# Patient Record
Sex: Female | Born: 1990 | Race: White | Hispanic: No | Marital: Single | Smoking: Current every day smoker
Health system: Southern US, Community
[De-identification: ages and names within clinical notes are randomized; demographics above are authoritative.]

## PROBLEM LIST (undated history)

## (undated) DIAGNOSIS — E119 Type 2 diabetes mellitus without complications: Secondary | ICD-10-CM

## (undated) DIAGNOSIS — E785 Hyperlipidemia, unspecified: Secondary | ICD-10-CM

## (undated) DIAGNOSIS — I10 Essential (primary) hypertension: Secondary | ICD-10-CM

## (undated) HISTORY — DX: Essential (primary) hypertension: I10

## (undated) HISTORY — DX: Hyperlipidemia, unspecified: E78.5

---
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
## 2011-09-07 ENCOUNTER — Emergency Department
Admit: 2011-09-07 | Disposition: A | Discharge status: Home / Self Care | Payer: Self-pay | Source: Emergency Department | Admitting: Emergency Medicine

## 2011-09-07 LAB — CBC AND DIFFERENTIAL
Basophils Absolute Automated: 0.03 10*3/uL (ref 0.00–0.20)
Basophils Automated: 0 % (ref 0–2)
Eosinophils Absolute Automated: 0.21 10*3/uL (ref 0.00–0.70)
Eosinophils Automated: 2 % (ref 0–5)
Hematocrit: 33 % — ABNORMAL LOW (ref 37.0–47.0)
Hgb: 10.9 g/dL — ABNORMAL LOW (ref 12.0–16.0)
Lymphocytes Absolute Automated: 3.05 10*3/uL (ref 0.50–4.40)
Lymphocytes Automated: 24 % (ref 15–41)
MCH: 29.6 pg (ref 28.0–32.0)
MCHC: 33 g/dL (ref 32.0–36.0)
MCV: 89.7 fL (ref 80.0–100.0)
MPV: 9.8 fL (ref 9.4–12.3)
Monocytes Absolute Automated: 1.03 10*3/uL (ref 0.00–1.20)
Monocytes: 8 % (ref 0–11)
Neutrophils Absolute: 8.49 10*3/uL — ABNORMAL HIGH (ref 1.80–8.10)
Neutrophils: 66 % (ref 52–75)
Platelets: 322 10*3/uL (ref 140–400)
RBC: 3.68 10*6/uL — ABNORMAL LOW (ref 4.20–5.40)
RDW: 14 % (ref 12–15)
WBC: 12.81 10*3/uL — ABNORMAL HIGH (ref 3.50–10.80)

## 2011-09-07 LAB — COMPREHENSIVE METABOLIC PANEL
ALT: 44 U/L — ABNORMAL HIGH (ref 4–36)
AST (SGOT): 31 U/L (ref 10–41)
Albumin/Globulin Ratio: 1.4 (ref 1.1–1.8)
Albumin: 4 g/dL (ref 3.4–4.9)
Alkaline Phosphatase: 67 U/L (ref 43–112)
BUN: 12 mg/dL (ref 7–21)
Bilirubin, Total: 0.5 mg/dL (ref 0.2–1.0)
CO2: 29 mEq/L (ref 22–31)
Calcium: 9.7 mg/dL (ref 8.6–10.2)
Chloride: 101 mEq/L (ref 98–107)
Creatinine: 0.5 mg/dL (ref 0.5–1.4)
Globulin: 2.9 g/dL (ref 2.0–3.7)
Glucose: 159 mg/dL — ABNORMAL HIGH (ref 70–100)
Potassium: 4.2 mEq/L (ref 3.6–5.0)
Protein, Total: 6.9 g/dL (ref 6.0–8.0)
Sodium: 141 mEq/L (ref 136–143)

## 2011-09-07 LAB — PT AND APTT
PT INR: 0.9 (ref 0.9–1.1)
PT: 12.5 s — ABNORMAL LOW (ref 12.6–15.0)
PTT: 27 s (ref 23–37)

## 2011-09-07 LAB — GFR: EGFR: >60

## 2011-09-07 LAB — HCG, SERUM, QUALITATIVE: Hcg Qualitative: NEGATIVE

## 2013-05-18 ENCOUNTER — Ambulatory Visit
Admission: RE | Admit: 2013-05-18 | Discharge: 2013-05-18 | Disposition: A | Discharge status: Home / Self Care | Payer: Self-pay | Source: Ambulatory Visit | Attending: Internal Medicine | Admitting: Internal Medicine

## 2013-09-23 ENCOUNTER — Ambulatory Visit
Admission: RE | Admit: 2013-09-23 | Discharge: 2013-09-23 | Disposition: A | Discharge status: Home / Self Care | Payer: Self-pay | Source: Ambulatory Visit | Attending: Internal Medicine | Admitting: Internal Medicine

## 2014-08-27 ENCOUNTER — Emergency Department: Payer: BLUE CROSS/BLUE SHIELD

## 2014-08-27 ENCOUNTER — Emergency Department
Admission: EM | Admit: 2014-08-27 | Discharge: 2014-08-27 | Disposition: A | Discharge status: Home / Self Care | Payer: BLUE CROSS/BLUE SHIELD | Attending: Emergency Medicine | Admitting: Emergency Medicine

## 2014-08-27 DIAGNOSIS — Z7689 Persons encountering health services in other specified circumstances: Secondary | ICD-10-CM

## 2014-08-27 DIAGNOSIS — R030 Elevated blood-pressure reading, without diagnosis of hypertension: Secondary | ICD-10-CM | POA: Insufficient documentation

## 2014-08-27 DIAGNOSIS — L0591 Pilonidal cyst without abscess: Secondary | ICD-10-CM | POA: Insufficient documentation

## 2014-08-27 DIAGNOSIS — IMO0001 Reserved for inherently not codable concepts without codable children: Secondary | ICD-10-CM

## 2014-08-27 MED ORDER — IBUPROFEN 600 MG PO TABS
600.0000 mg | ORAL_TABLET | Freq: Four times a day (QID) | ORAL | Status: AC | PRN
Start: 2014-08-27 — End: ?

## 2014-08-27 MED ORDER — TRAMADOL HCL 50 MG PO TABS
50.0000 mg | ORAL_TABLET | Freq: Four times a day (QID) | ORAL | Status: DC | PRN
Start: 2014-08-27 — End: 2018-04-08

## 2014-08-27 MED ORDER — CLINDAMYCIN HCL 300 MG PO CAPS
300.0000 mg | ORAL_CAPSULE | Freq: Four times a day (QID) | ORAL | Status: AC
Start: 2014-08-27 — End: 2014-09-01

## 2014-08-27 NOTE — ED Notes (Signed)
Swelling and pain x 3-4 days to coccyx. No fever or chills.  No draiange from area.

## 2014-08-27 NOTE — ED Provider Notes (Signed)
EMERGENCY DEPARTMENT HISTORY AND PHYSICAL EXAM     Physician/Midlevel provider first contact with patient: 08/27/14 1741         Date: 08/27/2014  Patient Name: Megan Harding    History of Presenting Illness     Chief Complaint   Patient presents with   . Tailbone Pain       History Provided By: Patient    Chief Complaint: Pain and Swelling   Onset: 3-4 days ago  Timing: Gradual  Location: Top of Tailbone region   Quality: Painful and Swelling   Severity: Moderate  Modifying Factors: Difficulty sitting and lying down   Associated Symptoms: nausea     Additional History: Megan Harding is a 23 y.o. female presenting to the ED c/o gradually worsening swelling and pain to top tailbone region x 3-4 days ago. Pt sts pain has caused difficulty with sitting and lying down. Pt additionally reports nausea. Pt notes she has never experienced similar sx in the past. Denies fever, chills, rash, itching, drainage from affected area, gait changes, vomiting, and abdominal pain. No hx of HTN, elevated BP noticed in ED upon arrival. No other medical complaints. NKDA.     PCP: Pcp, Noneorunknown, MD    No current facility-administered medications for this encounter.     Current Outpatient Prescriptions   Medication Sig Dispense Refill   . clindamycin (CLEOCIN) 300 MG capsule Take 1 capsule (300 mg total) by mouth 4 (four) times daily. 20 capsule 0   . ibuprofen (ADVIL,MOTRIN) 600 MG tablet Take 1 tablet (600 mg total) by mouth every 6 (six) hours as needed for Pain or Fever. 30 tablet 0   . traMADol (ULTRAM) 50 MG tablet Take 1 tablet (50 mg total) by mouth every 6 (six) hours as needed for Pain (severe pain). 5 tablet 0       Past History     Past Medical History:  History reviewed. No pertinent past medical history.    Past Surgical History:  History reviewed. No pertinent past surgical history.    Family History:  No family history on file.    Social History:  History   Substance Use Topics   . Smoking status: Current Every Day  Smoker   . Smokeless tobacco: Never Used   . Alcohol Use: No       Allergies:  No Known Allergies    Review of Systems       Constitutional: Negative for fever or chills.   Neurological: Negative for speech changes, weakness, or numbness.  Eyes: Negative for visual changes or eye pain.  HENT: No headache. Negative for sore throat, neck pain, or runny nose.   Gastrointestinal: + for nausea. Negative for abdominal pain,  vomiting, diarrhea, or blood in stool.   Genitourinary: Negative for dysuria or hematuria.  Musculoskeletal:  Negative for gait changes.    Skin: Negative for itching, rash, or discharge. + for pain and swelling to top region of tailbone.  Hematological: Negative for easy bruising        Physical Exam   BP 153/91 mmHg  Pulse 103  Temp(Src) 98.4 F (36.9 C)  Resp 18  Ht 1.727 m  Wt 136.079 kg  BMI 45.63 kg/m2  SpO2 99%      Physical Exam   Constitutional: Oriented to person, place, and time and well-developed, well-nourished, and in no distress.   Head: Normocephalic and atraumatic.   Eyes: Conjunctivae normal and EOM are normal. Pupils are equal, round, and reactive to  light.    Neck: Normal range of motion. Neck supple. No thyromegaly present.   Abdominal: Soft. Non distended. Non tender. No rebound or guarding  Musculoskeletal: No peripheral edema. No calf swelling or tenderness.    Neurological: Patient is alert and oriented to person, place, and time. No cranial nerve deficit. Gait normal. GCS score is 15.   Skin: Area of swelling to superior aspect of the gluteal cleft, just right of midline, 2 cm in length. Erythematous. Tender. Fluctuant. No rash.   Psychiatric: Affect normal.       Diagnostic Study Results     Labs -     Results     ** No results found for the last 24 hours. **          Radiologic Studies -   Radiology Results (24 Hour)     ** No results found for the last 24 hours. **      .      Medical Decision Making   I am the first provider for this patient.    I reviewed the  vital signs, available nursing notes, past medical history, past surgical history, family history and social history.    Vital Signs: Reviewed the patient's vital signs.     Patient Vitals for the past 12 hrs:   BP Temp Pulse Resp   08/27/14 1836 (!) 153/91 mmHg - (!) 103 -   08/27/14 1739 (!) 164/101 mmHg 98.4 F (36.9 C) (!) 110 18       Pulse Oximetry Analysis: Normal 99% on RA    Procedures:    Incision/Drainage  Date/Time: 08/27/2014 6:04 PM  Performed by: Cherlyn Roberts  Authorized by: Cherlyn Roberts    Consent:     Consent obtained:  Verbal    Consent given by:  Patient  Location:     Type:  Abscess and pilonidal cyst    Size:  2 cm    Location: Superior aspect of the gluteal cleft, just right of midline.  Pre-procedure details:     Skin preparation:  Betadine  Sedation:     Sedation type: None.  Anesthesia (see MAR for exact dosages):     Anesthesia method:  None  Procedure details:     Complexity:  Simple    Incision types:  Stab incision    Scalpel blade:  11    Wound management:  Probed and deloculated    Drainage:  Purulent    Drainage amount:  Copious    Wound treatment:  Wound left open    Packing materials:  1/2 in gauze  Post-procedure details:     Patient tolerance of procedure:  Tolerated well, no immediate complications      Old Medical Records: Old medical records.  Nursing notes.     ED Course:     6:04 PM - I&D performed.    6:18 PM - Counseled on diagnosis, discussed plan of home care after I&D, f/u plans, prescribed Rxs, and signs and symptoms when to return to ED. Pt understands and agrees with plan. All questions and concerns were addressed and resolved.    6:30 PM - Pt is stable and ready for discharge.      Provider Notes: s/p drainage of pilonidal cyst. rx for abx given. D/c home. May follow up for wound check tomorrow.       Diagnosis     Clinical Impression:   1. Pilonidal cyst    2. Elevated blood pressure  3. Encounter for incision and drainage procedure        Treatment  Plan:   ED Disposition     Discharge Myles Lipps discharge to home/self care.    Condition at disposition: Stable          _______________________________    Attestations: This note is prepared by Raelene Bott acting as scribe for Audley Hose, MD    Audley Hose, MD - The scribe's documentation has been prepared under my direction and personally reviewed by me in its entirety.  I confirm that the note above accurately reflects all work, treatment, procedures, and medical decision making performed by me.      _______________________________                 Cherlyn Roberts, MD  09/03/14 [phone]

## 2014-08-27 NOTE — Discharge Instructions (Signed)
Please follow up with a primary care doctor. Contact information attached.     Please return for removal of packing and worsening symptoms. You can also remove packing at home.      Pilonidal Abscess    You have been diagnosed with a pilonidal abscess.    A Pilonidal abscess is a collection of pus (infection material). The pus collects in the natal cleft, which is commonly called the butt crack. The pus tends to get infected and cause great pain. It also causes drainage of very bad-smelling and pus-like drainage. This type of abscess is often caused by an abnormal tract (pathway) between the skin and deeper structures that causes repeat infections in the same place. The word "pilonidal" means a "nest of hairs." However, at surgery, only 50-75% of all pilonidal abscesses (50-75 out of 100) actually contain hair.     Adult men get pilonidal abscesses 3-4 times more than women. In children however, they are four times more common in girls. They happen mostly in adults in their 46s to 30s. They are rare after the age of 19.    Risk factors for pilonidal disease include: Being female, having a lot of body hair and being Caucasian (white). Working in a job with a lot of sitting, having a deep natal cleft (butt crack) and having hair in the natal cleft are risk factors. Obesity is a risk factor for having the problem recur (happen again).    Some symptoms are: Pain, swelling, redness and pain over the crease of the buttocks. Sometimes, there is fever (temperature higher than 100.75F / 38C). For more information about pilonidal abscesses, go to: https://www.stewart-rogers.com/      Your abscess was drained. This involves numbing up the skin over the abscess, sterilizing the area and making an incision. This is to let the pus drain out. Often, a small "wick" or packing is placed in the wound. This is to allow further drainage. The packing or wick must be changed in 1-2 days. Your symptoms should get a lot better over the 2 days  after drainage. The doctor will decide if you need antibiotics now for your abscess.   Follow-up IS VERY IMPORTANT. Plan to return here, go to the nearest Emergency Department or see your regular doctor in 1-2 days. This is to take out packing and check your progress.    There is about a 40% chance (4 out of 10) your abscess will come back. Therefore, make an appointment with a surgeon in the next few weeks. Sometimes the abnormal tract that caused the infection needs to be taken out.    YOU SHOULD SEEK MEDICAL ATTENTION IMMEDIATELY, EITHER HERE OR AT THE NEAREST EMERGENCY DEPARTMENT, IF ANY OF THE FOLLOWING OCCURS:   High fever (temperature higher than 100.75F / 38C) or shaking chills develop.   You feel lightheaded or weak.   You have increasing pain, swelling, tenderness or redness.   You feel sicker at any time or do not get better as expected.   New symptoms or concerns develop.         Elevated Blood Pressure    During your visit today your blood pressure was higher than normal.    Check your blood pressure several times over the next several days, then follow up with your regular doctor. If you do not have a doctor, ask the medical staff to refer you to one.    You may need medication for your blood pressure if it stays high.  Untreated high blood pressure can cause damage to your heart and kidneys and may lead to a heart attack or stroke. It is VERY IMPORTANT to follow up with your doctor.   Check your blood pressure daily and follow up with your doctor.   A doctor will diagnose high blood pressure only if your blood pressure is high for several days. Many pharmacies have machines that let you check your own blood pressure. You can also check with a fire station to see whether a paramedic will take your blood pressure. Another option is to purchase a blood pressure monitor to use at home. These are available at most pharmacies.     YOU SHOULD SEEK MEDICAL ATTENTION IMMEDIATELY, EITHER HERE OR  AT THE NEAREST EMERGENCY DEPARTMENT, IF ANY OF THE FOLLOWING OCCURS:   You have a sudden or severe headache.   You are numb, tingly, or weak on one side of your body, half of your face droops, or you have trouble speaking.   You have chest pain.   You are short of breath.

## 2014-08-31 ENCOUNTER — Emergency Department
Admission: EM | Admit: 2014-08-31 | Discharge: 2014-08-31 | Disposition: A | Discharge status: Home / Self Care | Payer: BLUE CROSS/BLUE SHIELD | Attending: Emergency Medicine | Admitting: Emergency Medicine

## 2014-08-31 ENCOUNTER — Emergency Department: Payer: BLUE CROSS/BLUE SHIELD

## 2014-08-31 DIAGNOSIS — L0591 Pilonidal cyst without abscess: Secondary | ICD-10-CM

## 2014-08-31 DIAGNOSIS — Z5189 Encounter for other specified aftercare: Secondary | ICD-10-CM

## 2014-08-31 DIAGNOSIS — Z09 Encounter for follow-up examination after completed treatment for conditions other than malignant neoplasm: Secondary | ICD-10-CM | POA: Insufficient documentation

## 2014-08-31 MED ORDER — SULFAMETHOXAZOLE-TMP DS 800-160 MG PO TABS
1.0000 | ORAL_TABLET | Freq: Once | ORAL | Status: AC
Start: 2014-08-31 — End: 2014-08-31
  Administered 2014-08-31: 1 via ORAL
  Filled 2014-08-31: qty 1

## 2014-08-31 MED ORDER — SULFAMETHOXAZOLE-TRIMETHOPRIM 800-160 MG PO TABS
1.0000 | ORAL_TABLET | Freq: Two times a day (BID) | ORAL | Status: DC
Start: 2014-08-31 — End: 2018-04-08

## 2014-08-31 NOTE — Discharge Instructions (Signed)
Wound Recheck    You have been seen today for a recheck of your wound.    Your wound appears to be doing well. There are no signs of infection or worsening of the wound.    Continue with your wound care. No further exams are necessary.    YOU SHOULD SEEK MEDICAL ATTENTION IMMEDIATELY, EITHER HERE OR AT THE NEAREST EMERGENCY DEPARTMENT, IF ANY OF THE FOLLOWING OCCURS:   Your wound looks red, you see pus, or you have pain, swelling, or fever (temperature higher than 100.36F / 38C). These are signs of infection.   You notice red streaks spreading from the site of the wound.   Your wound seems worse in any way.         Pilonidal Abscess    You have been diagnosed with a pilonidal abscess.    A Pilonidal abscess is a collection of pus (infection material). The pus collects in the natal cleft, which is commonly called the butt crack. The pus tends to get infected and cause great pain. It also causes drainage of very bad-smelling and pus-like drainage. This type of abscess is often caused by an abnormal tract (pathway) between the skin and deeper structures that causes repeat infections in the same place. The word "pilonidal" means a "nest of hairs." However, at surgery, only 50-75% of all pilonidal abscesses (50-75 out of 100) actually contain hair.     Adult men get pilonidal abscesses 3-4 times more than women. In children however, they are four times more common in girls. They happen mostly in adults in their 24s to 30s. They are rare after the age of 38.    Risk factors for pilonidal disease include: Being female, having a lot of body hair and being Caucasian (white). Working in a job with a lot of sitting, having a deep natal cleft (butt crack) and having hair in the natal cleft are risk factors. Obesity is a risk factor for having the problem recur (happen again).    Some symptoms are: Pain, swelling, redness and pain over the crease of the buttocks. Sometimes, there is fever (temperature higher than  100.36F / 38C). For more information about pilonidal abscesses, go to: https://www.stewart-rogers.com/      Your abscess was drained. This involves numbing up the skin over the abscess, sterilizing the area and making an incision. This is to let the pus drain out. Often, a small "wick" or packing is placed in the wound. This is to allow further drainage. The packing or wick must be changed in 1-2 days. Your symptoms should get a lot better over the 2 days after drainage. The doctor will decide if you need antibiotics now for your abscess.    There is about a 40% chance (4 out of 10) your abscess will come back. Therefore, make an appointment with a surgeon in the next few weeks. Sometimes the abnormal tract that caused the infection needs to be taken out.    YOU SHOULD SEEK MEDICAL ATTENTION IMMEDIATELY, EITHER HERE OR AT THE NEAREST EMERGENCY DEPARTMENT, IF ANY OF THE FOLLOWING OCCURS:   High fever (temperature higher than 100.36F / 38C) or shaking chills develop.   You feel lightheaded or weak.   You have increasing pain, swelling, tenderness or redness.   You feel sicker at any time or do not get better as expected.   New symptoms or concerns develop.         Sitz Bath (Edu)    You have been  instructed to take "sitz baths."    A sitz bath is a warm water bath used to cleanse and relieve pain in the perineal area (the skin on or around the vagina or buttocks). Sitz baths are usually recommended after childbirth or hemorrhoid surgery or for other painful rectal or anal conditions. They can help relieve discomfort, itching, and muscle spasms from inflammatory bowel disease and other conditions. They also help heal surgical wounds faster.    Take at least three sitz baths a day to clean and relieve discomfort in your perineal area, especially after urinating or having bowel movements. You can do it more often if needed. Keep taking sitz baths until your symptoms improve.     To take a sitz bath, place a plastic tub  over the toilet and fill it with warm water. Sit in the water for 15 to 20 minutes. If you prefer, you can sit in a bathtub filled with a few inches of water. Using a plastic sitz bath that fits over the toilet may be more convenient.     You can buy a sitz bath at most drug or medical supply stores. The medical staff may provide you with a sitz bath to take home following surgery or childbirth.    Prepare the items you will need for your sitz bath so everything is ready to go when you need to use it. You will need a soft towel, and a sitz bath kit, including tubing and a regulating clamp. Your doctor might also recommend adding medicine or salt to the bath.    DIRECTIONS: These are general directions. Please read the instructions in the package for more information.   Place the sitz bath over the toilet and make sure that it fits well.   Fill the bath with warm water. Be sure to hang the tubing used to fill the sitz bath at a level higher than the toilet.   A tube will run from the bag into the plastic basin (sitz bath) to ensure a continuous flow of water. There is a clamp to control the amount of water in the basin.   Sit in the sitz bath for at least 15-20 minutes.   When you are finished, use a soft towel to gently pat yourself dry. Remember to pat instead of rub.

## 2014-08-31 NOTE — ED Provider Notes (Signed)
EMERGENCY DEPARTMENT HISTORY AND PHYSICAL EXAM     Physician/Midlevel provider first contact with patient: 08/31/14 2116         Date: 08/31/2014  Patient Name: Megan Harding    History of Presenting Illness     Chief Complaint   Patient presents with   . Wound Check       History Provided By: Patient and Mother      Chief Complaint: Wound Check   Onset: today   Timing: constant   Severity: moderate  Modifying Factors: none   Associated Symptoms: none    Additional History: Megan Harding is a 23 y.o. female who presents to ED due to wound check of her pilonidal cyst x today. Pt noticed the cyst since last Sunday, 5 days ago.  Pt was seen here on 08/27/2014 and had an I&D w/ packing for the pilonidal cyst.  Pt removed the packing herself. Pt has been taking the Clindamycin as directed. Pt was instructed to come here for follow up.Pt denies fever or any other symptoms.     PCP: Pcp, Noneorunknown, MD      No current facility-administered medications for this encounter.     Current Outpatient Prescriptions   Medication Sig Dispense Refill   . clindamycin (CLEOCIN) 300 MG capsule Take 1 capsule (300 mg total) by mouth 4 (four) times daily. 20 capsule 0   . ibuprofen (ADVIL,MOTRIN) 600 MG tablet Take 1 tablet (600 mg total) by mouth every 6 (six) hours as needed for Pain or Fever. 30 tablet 0   . sulfamethoxazole-trimethoprim (BACTRIM DS) 800-160 MG per tablet Take 1 tablet by mouth 2 (two) times daily. 20 tablet no   . traMADol (ULTRAM) 50 MG tablet Take 1 tablet (50 mg total) by mouth every 6 (six) hours as needed for Pain (severe pain). 5 tablet 0       Past History     Past Medical History:  Past Medical History   Diagnosis Date   . Hypertension        Past Surgical History:  History reviewed. No pertinent past surgical history.    Family History:  History reviewed. No pertinent family history.    Social History:  History   Substance Use Topics   . Smoking status: Current Every Day Smoker -- 0.25 packs/day for  10 years     Types: Cigarettes   . Smokeless tobacco: Never Used   . Alcohol Use: No       Allergies:  No Known Allergies    Review of Systems     Review of Systems   Constitutional: Negative for fever and chills.        Positive for wound check.    HENT: Negative for ear discharge and hearing loss.    Eyes: Negative for discharge and redness.   Respiratory: Negative for choking.    Gastrointestinal: Negative for vomiting.   Genitourinary: Negative for difficulty urinating.   Musculoskeletal: Negative for gait problem.   Skin:        Positive for pilonidal cyst.    Allergic/Immunologic:        NKDA   Neurological: Negative for tremors.     Physical Exam   BP 154/92 mmHg  Pulse 109  Temp(Src) 98.6 F (37 C)  Resp 16  Ht 1.727 m  Wt 136.079 kg  BMI 45.63 kg/m2  SpO2 96%    Vital signs have been reviewed  Constitutional: Well appearing, non toxic  HEAD: Normocephalic, atraumatic  EYES: PERRL, EOMI  ENT: Normal pharynx, nasopharynx clear, tympanic membranes free bilaterally  NECK: Supple, FROM, NT, no meningeal signs  GI: Normal bowel sounds, non distended, non tender, soft, no guarding or rebound  BACK: No evidence of trauma or deformity.  No tenderness along thoracic or lumbar spine  SKIN: Warm and dry, in intergluteal cleft, there is a opening of approximately 0.5 cm in diameter with scan amount of pus draining.  There is surrounding induration measuring approximately 2 cm in both directions, nontender.    NEURO: AxOx3, CN 2-12 intact, strength 5/5 B, sensation intact B.      Diagnostic Study Results     Labs -     Results     ** No results found for the last 24 hours. **          Radiologic Studies -   Radiology Results (24 Hour)     ** No results found for the last 24 hours. **      .      Medical Decision Making   I am the first provider for this patient.    I reviewed the vital signs, available nursing notes, past medical history, past surgical history, family history and social history.    Vital  Signs-Reviewed the patient's vital signs.     Patient Vitals for the past 12 hrs:   BP Temp Pulse Resp   08/31/14 2105 (!) 154/92 mmHg 98.6 F (37 C) (!) 109 16       Pulse Oximetry Analysis - Normal 96% on RA    Old Medical Records: Old medical records.  Nursing notes.     ED Course:   9:37 PM -  Patient  is amenable for discharge. Discussed d/c instructions including sitz bath and outpatient f/u with colorectal surgeon, Dr. Venetia Maxon. Advised pt to take the Bactrim as prescribed in addition to taking her Clindamycin as previously prescribed.  Discussed return precautions. Pt verbalizes understanding and agrees with plan. All questions and concerns were addressed.     Provider Notes: 61 F has a draining pilonidal cyst.  There's induration that's not erythematous.  No need for further incision.  Given additional antibiotics, instructions for sitz baths and general surgery referral if needed.   Patient agrees to return for new or worsening symptoms.        Diagnosis     Clinical Impression:   1. Pilonidal cyst    2. Visit for wound check        _______________________________    Attestations:  This note is prepared by Tammi Klippel, acting as scribe for Brooke Bonito, DO    [place], [place], The scribe's documentation has been prepared under my direction and personally reviewed by me in its entirety.  I confirm that the note above accurately reflects all work, treatment, procedures, and medical decision making performed by me.      _______________________________            Brooke Bonito, DO  09/01/14 [phone]

## 2014-08-31 NOTE — ED Notes (Addendum)
Follow up for pilonidal  cyst, had incision and drainage on Sunday, packing removed by pt, taking clindamycin

## 2018-09-19 ENCOUNTER — Encounter (HOSPITAL_COMMUNITY): Payer: Self-pay | Admitting: Emergency Medicine

## 2018-09-19 ENCOUNTER — Ambulatory Visit (HOSPITAL_COMMUNITY)
Admission: EM | Admit: 2018-09-19 | Discharge: 2018-09-19 | Disposition: A | Discharge status: Home / Self Care | Payer: Self-pay | Attending: Internal Medicine | Admitting: Internal Medicine

## 2018-09-19 DIAGNOSIS — Z3202 Encounter for pregnancy test, result negative: Secondary | ICD-10-CM

## 2018-09-19 DIAGNOSIS — B9689 Other specified bacterial agents as the cause of diseases classified elsewhere: Secondary | ICD-10-CM | POA: Insufficient documentation

## 2018-09-19 DIAGNOSIS — E1165 Type 2 diabetes mellitus with hyperglycemia: Secondary | ICD-10-CM

## 2018-09-19 DIAGNOSIS — N390 Urinary tract infection, site not specified: Secondary | ICD-10-CM

## 2018-09-19 DIAGNOSIS — F17200 Nicotine dependence, unspecified, uncomplicated: Secondary | ICD-10-CM | POA: Insufficient documentation

## 2018-09-19 DIAGNOSIS — R319 Hematuria, unspecified: Secondary | ICD-10-CM

## 2018-09-19 DIAGNOSIS — Z975 Presence of (intrauterine) contraceptive device: Secondary | ICD-10-CM | POA: Insufficient documentation

## 2018-09-19 HISTORY — DX: Type 2 diabetes mellitus without complications: E11.9

## 2018-09-19 LAB — POCT URINALYSIS DIP (DEVICE)
BILIRUBIN URINE: NEGATIVE
Glucose, UA: >=1000 mg/dL — AB
Nitrite: POSITIVE — AB
PH: 7 (ref 5.0–8.0)
Protein, ur: 100 mg/dL — AB
Specific Gravity, Urine: 1.015 (ref 1.005–1.030)
Urobilinogen, UA: 0.2 mg/dL (ref 0.0–1.0)

## 2018-09-19 LAB — POCT PREGNANCY, URINE: PREG TEST UR: NEGATIVE

## 2018-09-19 LAB — GLUCOSE, CAPILLARY: GLUCOSE-CAPILLARY: 284 mg/dL — AB (ref 70–99)

## 2018-09-19 MED ORDER — METFORMIN HCL 500 MG PO TABS: 500.0000 mg | ORAL_TABLET | Freq: Every day | ORAL | 1 refills | Status: DC

## 2018-09-19 MED ORDER — PHENAZOPYRIDINE HCL 200 MG PO TABS: 200.0000 mg | ORAL_TABLET | Freq: Three times a day (TID) | ORAL | 0 refills | Status: DC

## 2018-09-19 MED ORDER — CEPHALEXIN 500 MG PO CAPS: 500.0000 mg | ORAL_CAPSULE | Freq: Two times a day (BID) | ORAL | 0 refills | Status: AC

## 2018-09-19 NOTE — Discharge Instructions (Addendum)
Urine culture sent.  We will call you with the results.   Push fluids and get plenty of rest.   Take antibiotic as directed and to completion Take pyridium as prescribed and as needed for symptomatic relief Follow up with PCP or with community Health if symptoms persists Return here or go to ER if you have any new or worsening symptoms such as fever, worsening abdominal pain, nausea/vomiting, flank pain, etc...  Please continue to monitor blood glucose at home and follow up with PCP or [facility] to establish care.   Metformin prescribed.  Take as directed.

## 2018-09-19 NOTE — ED Triage Notes (Signed)
Pt c/o flank pain and frequency with urination for several days.

## 2018-09-19 NOTE — ED Provider Notes (Signed)
[facility]   CC: UTI symptoms  SUBJECTIVE:  Lorenia Hoston is a 27 y.o. female who complains of urinary frequency, urgency, and flank pain x 2 days.  Patient denies a precipitating event, recent sexual encounter, excessive caffeine intake.  Complains of constant right sided flank pain.  Pain is 6/10.  Has tried OTC medications with minimal relief.  Symptoms are made worse with urination.  Denies similar symptoms in the past.  Complains of associated fever, chills.  Denies nausea, vomiting, abdominal pain, abnormal vaginal discharge or bleeding, hematuria.    LMP: No LMP recorded. (Menstrual status: IUD).  ROS: As in HPI.  Past Medical History:  Diagnosis Date  . Diabetes mellitus without complication (HCC)    History reviewed. No pertinent surgical history. No Known Allergies No current facility-administered medications on file prior to encounter.    No current outpatient medications on file prior to encounter.   Social History   Socioeconomic History  . Marital status: Single    Spouse name: Not on file  . Number of children: Not on file  . Years of education: Not on file  . Highest education level: Not on file  Occupational History  . Not on file  Social Needs  . Financial resource strain: Not on file  . Food insecurity:    Worry: Not on file    Inability: Not on file  . Transportation needs:    Medical: Not on file    Non-medical: Not on file  Tobacco Use  . Smoking status: Current Every Day Smoker  Substance and Sexual Activity  . Alcohol use: Never    Frequency: Never  . Drug use: Never  . Sexual activity: Not on file  Lifestyle  . Physical activity:    Days per week: Not on file    Minutes per session: Not on file  . Stress: Not on file  Relationships  . Social connections:    Talks on phone: Not on file    Gets together: Not on file    Attends religious service: Not on file    Active member of club or organization: Not on file    Attends  meetings of clubs or organizations: Not on file    Relationship status: Not on file  . Intimate partner violence:    Fear of current or ex partner: Not on file    Emotionally abused: Not on file    Physically abused: Not on file    Forced sexual activity: Not on file  Other Topics Concern  . Not on file  Social History Narrative  . Not on file   Family History  Problem Relation Age of Onset  . Cancer Mother     OBJECTIVE:  Vitals:   09/19/18 1748  BP: (!) 162/91  Pulse: 93  Resp: 16  Temp: 97.7 F (36.5 C)  SpO2: 98%   General appearance: AOx3 in no acute distress; laughing during encounter HEENT: NCAT.  Oropharynx clear.  Lungs: clear to auscultation bilaterally without adventitious breath sounds Heart: regular rate and rhythm.  Radial pulses 2+ symmetrical bilaterally Abdomen: soft; non-distended; no tenderness; bowel sounds present; no masses or organomegaly; no guarding or rebound tenderness Back: Mild RT sided CVA tenderness Extremities: no edema; symmetrical with no gross deformities Skin: warm and dry Neurologic: Ambulates from chair to exam table without difficulty Psychological: alert and cooperative; normal mood and affect  Labs Reviewed  GLUCOSE, CAPILLARY - Abnormal; Notable for the following components:  Result Value   Glucose-Capillary 284 (*)    All other components within normal limits  POCT URINALYSIS DIP (DEVICE) - Abnormal; Notable for the following components:   Glucose, UA >=1000 (*)    Ketones, ur TRACE (*)    Hgb urine dipstick LARGE (*)    Protein, ur 100 (*)    Nitrite POSITIVE (*)    Leukocytes, UA TRACE (*)    All other components within normal limits  POCT PREGNANCY, URINE    ASSESSMENT & PLAN:  1. Urinary tract infection with hematuria, site unspecified   2. Type 2 diabetes mellitus with hyperglycemia, without long-term current use of insulin (HCC)     Meds ordered this encounter  Medications  . cephALEXin (KEFLEX) 500 MG  capsule    Sig: Take 1 capsule (500 mg total) by mouth 2 (two) times daily for 10 days.    Dispense:  20 capsule    Refill:  0    Order Specific Question:   Supervising Provider    Answer:   Isa Rankin [phone]  . phenazopyridine (PYRIDIUM) 200 MG tablet    Sig: Take 1 tablet (200 mg total) by mouth 3 (three) times daily.    Dispense:  6 tablet    Refill:  0    Order Specific Question:   Supervising Provider    Answer:   Isa Rankin [phone]  . metFORMIN (GLUCOPHAGE) 500 MG tablet    Sig: Take 1 tablet (500 mg total) by mouth daily.    Dispense:  30 tablet    Refill:  1    Order Specific Question:   Supervising Provider    Answer:   Isa Rankin [[id number]]   Urine culture sent.  We will call you with the results.   Push fluids and get plenty of rest.   Take antibiotic as directed and to completion Take pyridium as prescribed and as needed for symptomatic relief Follow up with PCP or with community Health if symptoms persists Return here or go to ER if you have any new or worsening symptoms such as fever, worsening abdominal pain, nausea/vomiting, flank pain, etc...  Please continue to monitor blood glucose at home and follow up with PCP or [facility] to establish care.   Metformin prescribed.  Take as directed.    Outlined signs and symptoms indicating need for more acute intervention. Patient verbalized understanding. After Visit Summary given.     Rennis Harding, PA-C 09/19/18 1931

## 2018-09-19 NOTE — ED Triage Notes (Signed)
Pt states she just took azo

## 2018-09-22 LAB — URINE CULTURE: Culture: >=100000 — AB

## 2018-09-23 ENCOUNTER — Telehealth (HOSPITAL_COMMUNITY): Payer: Self-pay

## 2018-09-23 NOTE — Telephone Encounter (Signed)
Urine culture positive for E.coli, this was treated with keflex at urgent care visit. Attempted to reach patient. No answer at this time.

## 2019-09-02 ENCOUNTER — Ambulatory Visit (HOSPITAL_COMMUNITY)
Admission: EM | Admit: 2019-09-02 | Discharge: 2019-09-02 | Disposition: A | Discharge status: Home / Self Care | Payer: BC Managed Care – PPO | Attending: Emergency Medicine | Admitting: Emergency Medicine

## 2019-09-02 ENCOUNTER — Encounter (HOSPITAL_COMMUNITY): Payer: Self-pay

## 2019-09-02 ENCOUNTER — Other Ambulatory Visit: Payer: Self-pay

## 2019-09-02 DIAGNOSIS — T25222A Burn of second degree of left foot, initial encounter: Secondary | ICD-10-CM

## 2019-09-02 DIAGNOSIS — T310 Burns involving less than 10% of body surface: Secondary | ICD-10-CM | POA: Diagnosis not present

## 2019-09-02 DIAGNOSIS — Y92002 Bathroom of unspecified non-institutional (private) residence single-family (private) house as the place of occurrence of the external cause: Secondary | ICD-10-CM

## 2019-09-02 DIAGNOSIS — X088XXA Exposure to other specified smoke, fire and flames, initial encounter: Secondary | ICD-10-CM

## 2019-09-02 MED ORDER — SILVER SULFADIAZINE 1 % EX CREA: 1.0000 "application " | TOPICAL_CREAM | Freq: Two times a day (BID) | CUTANEOUS | 1 refills | Status: DC
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
## 2019-09-02 NOTE — Discharge Instructions (Addendum)
Keep your wound clean and dry.  Wash it gently with soap and water twice a day; then apply the Silvadene cream twice a day.    Return here or follow-up with your primary care provider if you have increased pain, redness, red streaks, pus-like drainage, fever, or other signs of infection.
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
## 2019-09-17 LAB — TSH: TSH: 1.45 (ref 0.41–5.90)

## 2019-09-17 LAB — LIPID PANEL
Cholesterol: 199 (ref 0–200)
HDL: 42 (ref 35–70)
LDL Cholesterol: 104
Triglycerides: 311 — AB (ref 40–160)

## 2019-09-17 LAB — HEPATIC FUNCTION PANEL
ALT: 19 (ref 7–35)
AST: 13 (ref 13–35)
Alkaline Phosphatase: 65 (ref 25–125)

## 2019-09-17 LAB — HEMOGLOBIN A1C: Hemoglobin A1C: 10.5

## 2019-09-17 LAB — BASIC METABOLIC PANEL
BUN: 8 (ref 4–21)
Creatinine: 0.5 (ref 0.5–1.1)
Glucose: 266
Potassium: 4.4 (ref 3.4–5.3)
Sodium: 136 — AB (ref 137–147)

## 2019-09-19 ENCOUNTER — Telehealth: Payer: Self-pay | Admitting: Nurse Practitioner

## 2019-09-19 NOTE — Telephone Encounter (Signed)

## 2019-09-20 ENCOUNTER — Ambulatory Visit (INDEPENDENT_AMBULATORY_CARE_PROVIDER_SITE_OTHER): Payer: BC Managed Care – PPO | Admitting: Nurse Practitioner

## 2019-09-20 ENCOUNTER — Other Ambulatory Visit: Payer: Self-pay

## 2019-09-20 ENCOUNTER — Encounter: Payer: Self-pay | Admitting: Nurse Practitioner

## 2019-09-20 VITALS — BP 126/74 | HR 83 | Temp 98.4°F | Ht 67.0 in | Wt 267.0 lb

## 2019-09-20 DIAGNOSIS — E781 Pure hyperglyceridemia: Secondary | ICD-10-CM | POA: Diagnosis not present

## 2019-09-20 DIAGNOSIS — S91302A Unspecified open wound, left foot, initial encounter: Secondary | ICD-10-CM | POA: Diagnosis not present

## 2019-09-20 DIAGNOSIS — Z23 Encounter for immunization: Secondary | ICD-10-CM

## 2019-09-20 DIAGNOSIS — E1142 Type 2 diabetes mellitus with diabetic polyneuropathy: Secondary | ICD-10-CM | POA: Diagnosis not present

## 2019-09-20 DIAGNOSIS — Z720 Tobacco use: Secondary | ICD-10-CM | POA: Insufficient documentation

## 2019-09-20 MED ORDER — OMEGA-3-ACID ETHYL ESTERS 1 G PO CAPS: 1.0000 g | ORAL_CAPSULE | Freq: Two times a day (BID) | ORAL | 2 refills | Status: DC

## 2019-09-20 MED ORDER — TRULICITY 0.75 MG/0.5ML ~~LOC~~ SOAJ: 0.7500 mg | SUBCUTANEOUS | 2 refills | Status: DC

## 2019-09-20 MED ORDER — METFORMIN HCL 500 MG PO TABS: 500.0000 mg | ORAL_TABLET | Freq: Two times a day (BID) | ORAL | 2 refills | Status: AC

## 2019-09-20 MED ORDER — CEPHALEXIN 500 MG PO CAPS: 500.0000 mg | ORAL_CAPSULE | Freq: Three times a day (TID) | ORAL | 0 refills | Status: DC

## 2019-09-20 MED ORDER — BLOOD GLUCOSE MONITOR KIT: PACK | 0 refills | Status: AC

## 2019-09-20 MED ORDER — CEPHALEXIN 500 MG PO CAPS: 500.0000 mg | ORAL_CAPSULE | Freq: Two times a day (BID) | ORAL | 0 refills | Status: DC
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
## 2019-09-20 NOTE — Patient Instructions (Addendum)
Start glucose check BID. Bring glucose reading to next office visit. Start metformin and trulicity as prescribed  You will be contacted to schedule appt with wound clinic. Continue silvadene cream with dressing change for now. Use knee high compression stocking during the day and off at night. Start keflex to treat cellulitis   Burn Care, Adult A burn is an injury to the skin or the tissues under the skin. There are three types of burns:  First degree. These burns may cause the skin to be red and a bit swollen.  Second degree. These burns are very painful and cause the skin to be very red. The skin may also leak fluid, look shiny, and start to have blisters.  Third degree. These burns cause permanent damage. They turn the skin white or black and make it look charred, dry, and leathery. Taking care of your burn properly can help to prevent pain and infection. It can also help the burn to heal more quickly. How is this treated? Right after a burn:  Rinse or soak the burn under cool water. Do this for several minutes. Do not put ice on your burn. That can cause more damage.  Lightly cover the burn with a clean (sterile) cloth (dressing). Burn care  Raise (elevate) the injured area above the level of your heart while sitting or lying down.  Follow instructions from your doctor about: ? How to clean and take care of the burn. ? When to change and remove the cloth.  Check your burn every day for signs of infection. Check for: ? More redness, swelling, or pain. ? Warmth. ? Pus or a bad smell. Medicine   Take over-the-counter and prescription medicines only as told by your doctor.  If you were prescribed antibiotic medicine, take or apply it as told by your doctor. Do not stop using the antibiotic even if your condition improves. General instructions  To prevent infection: ? Do not put butter, oil, or other home treatments on the burn. ? Do not scratch or pick at the  burn. ? Do not break any blisters. ? Do not peel skin.  Do not rub your burn, even when you are cleaning it.  Protect your burn from the sun. Contact a doctor if:  Your condition does not get better.  Your condition gets worse.  You have a fever.  Your burn looks different or starts to have black or red spots on it.  Your burn feels warm to the touch.  Your pain is not controlled with medicine. Get help right away if:  You have redness, swelling, or pain at the site of the burn.  You have fluid, blood, or pus coming from your burn.  You have red streaks near the burn.  You have very bad pain. This information is not intended to replace advice given to you by your health care provider. Make sure you discuss any questions you have with your health care provider. Document Released: 09/16/2008 Document Revised: 03/30/2019 Document Reviewed: 05/27/2016 Elsevier Patient Education  2020 [employer].

## 2019-09-23 ENCOUNTER — Encounter: Payer: Self-pay | Admitting: Nurse Practitioner

## 2019-09-23 ENCOUNTER — Telehealth: Payer: Self-pay | Admitting: Nurse Practitioner

## 2019-09-23 DIAGNOSIS — E781 Pure hyperglyceridemia: Secondary | ICD-10-CM

## 2019-09-23 MED ORDER — FENOFIBRATE 145 MG PO TABS: 145.0000 mg | ORAL_TABLET | Freq: Every day | ORAL | 5 refills | Status: DC

## 2019-09-23 NOTE — Telephone Encounter (Signed)
changed to fenofibrate due to insurance coverage

## 2019-09-23 NOTE — Telephone Encounter (Signed)
PA denied for Omega 3.   Not sure if you want to try Fibrate and Statin med. Example atorvastatin and simvastatin, fenofibrate, fenofibric acid.

## 2019-09-23 NOTE — Progress Notes (Signed)
Abstracted result and sent to scan  

## 2019-09-29 NOTE — Telephone Encounter (Signed)
LVM for the pt to call back, need to inform that PA denied and [facility] send in new rx.

## 2019-10-03 NOTE — Telephone Encounter (Signed)
Left another vm for the pt to call back.  

## 2019-10-04 ENCOUNTER — Ambulatory Visit: Payer: BC Managed Care – PPO | Admitting: Nurse Practitioner

## 2019-10-17 ENCOUNTER — Encounter (HOSPITAL_BASED_OUTPATIENT_CLINIC_OR_DEPARTMENT_OTHER): Payer: BC Managed Care – PPO | Attending: Internal Medicine | Admitting: Internal Medicine

## 2019-10-21 ENCOUNTER — Encounter: Payer: Self-pay | Admitting: Nurse Practitioner

## 2019-10-24 ENCOUNTER — Encounter: Payer: Self-pay | Admitting: Nurse Practitioner

## 2019-11-02 ENCOUNTER — Encounter (HOSPITAL_COMMUNITY): Payer: Self-pay | Admitting: Emergency Medicine

## 2019-11-02 ENCOUNTER — Emergency Department (HOSPITAL_COMMUNITY): Payer: Self-pay

## 2019-11-02 ENCOUNTER — Other Ambulatory Visit: Payer: Self-pay

## 2019-11-02 ENCOUNTER — Emergency Department (HOSPITAL_COMMUNITY)
Admission: EM | Admit: 2019-11-02 | Discharge: 2019-11-02 | Disposition: A | Discharge status: Home / Self Care | Payer: Self-pay | Attending: Emergency Medicine | Admitting: Emergency Medicine

## 2019-11-02 DIAGNOSIS — E119 Type 2 diabetes mellitus without complications: Secondary | ICD-10-CM | POA: Insufficient documentation

## 2019-11-02 DIAGNOSIS — X031XXS Exposure to smoke in controlled fire, not in building or structure, sequela: Secondary | ICD-10-CM | POA: Insufficient documentation

## 2019-11-02 DIAGNOSIS — Z79899 Other long term (current) drug therapy: Secondary | ICD-10-CM | POA: Insufficient documentation

## 2019-11-02 DIAGNOSIS — Z7984 Long term (current) use of oral hypoglycemic drugs: Secondary | ICD-10-CM | POA: Insufficient documentation

## 2019-11-02 DIAGNOSIS — E11622 Type 2 diabetes mellitus with other skin ulcer: Secondary | ICD-10-CM

## 2019-11-02 DIAGNOSIS — T798XXA Other early complications of trauma, initial encounter: Secondary | ICD-10-CM | POA: Insufficient documentation

## 2019-11-02 DIAGNOSIS — F17210 Nicotine dependence, cigarettes, uncomplicated: Secondary | ICD-10-CM | POA: Insufficient documentation

## 2019-11-02 DIAGNOSIS — T148XXA Other injury of unspecified body region, initial encounter: Secondary | ICD-10-CM

## 2019-11-02 LAB — CBG MONITORING, ED: Glucose-Capillary: 203 mg/dL — ABNORMAL HIGH (ref 70–99)
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
## 2019-11-02 NOTE — ED Notes (Signed)
ED Provider at bedside. 

## 2019-11-02 NOTE — Discharge Instructions (Addendum)
Please call the [facility] to schedule an appointment for further outpatient management of foot wound.   Social Work should contact you tomorrow to discuss available resources for medications, and medical care.
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
## 2019-11-22 ENCOUNTER — Other Ambulatory Visit: Payer: Self-pay

## 2019-11-22 ENCOUNTER — Encounter (HOSPITAL_BASED_OUTPATIENT_CLINIC_OR_DEPARTMENT_OTHER): Payer: Self-pay | Attending: Internal Medicine | Admitting: Internal Medicine

## 2019-11-22 DIAGNOSIS — L97522 Non-pressure chronic ulcer of other part of left foot with fat layer exposed: Secondary | ICD-10-CM | POA: Insufficient documentation

## 2019-11-22 DIAGNOSIS — E781 Pure hyperglyceridemia: Secondary | ICD-10-CM | POA: Insufficient documentation

## 2019-11-22 DIAGNOSIS — E1142 Type 2 diabetes mellitus with diabetic polyneuropathy: Secondary | ICD-10-CM | POA: Insufficient documentation

## 2019-11-22 DIAGNOSIS — X088XXD Exposure to other specified smoke, fire and flames, subsequent encounter: Secondary | ICD-10-CM | POA: Insufficient documentation

## 2019-11-22 DIAGNOSIS — E11621 Type 2 diabetes mellitus with foot ulcer: Secondary | ICD-10-CM | POA: Insufficient documentation

## 2019-11-22 DIAGNOSIS — G4730 Sleep apnea, unspecified: Secondary | ICD-10-CM | POA: Insufficient documentation

## 2019-11-22 DIAGNOSIS — T25322D Burn of third degree of left foot, subsequent encounter: Secondary | ICD-10-CM | POA: Insufficient documentation

## 2019-11-28 NOTE — Progress Notes (Signed)
Bianca, [name] ([id number]) Visit Report for 11/22/2019 Abuse/Suicide Risk Screen Details Patient Name: Date of Service: Bianca [name], Bianca [name] 11/22/2019 9:00 AM Medical Record [id number] Patient Account Number: [id number] Date of Birth/Sex: Treating RN: Aug 06, 1991 (28 y.o. Female) Levan Hurst Primary Care Suyash Amory: Wilfred Lacy Other Clinician: Referring Michaell Grider: Treating Kayton Dunaj/Extender:Robson, Gae Bon, Trilby Drummer in Treatment: 0 Abuse/Suicide Risk Screen Items Answer ABUSE RISK SCREEN: Has anyone close to you tried to hurt or harm you recentlyo No Do you feel uncomfortable with anyone in your familyo No Has anyone forced you do things that you didnt want to doo No Electronic Signature(s) Signed: 11/28/2019 5:51:44 PM By: Levan Hurst RN, BSN Entered By: Levan Hurst on 11/22/2019 09:05:33 -------------------------------------------------------------------------------- Activities of Daily Living Details Patient Name: Date of Service: Bianca, [name] 11/22/2019 9:00 AM Medical Record [id number] Patient Account Number: [id number] Date of Birth/Sex: Treating RN: 1991-11-08 (28 y.o. Female) Levan Hurst Primary Care Elleana Stillson: Wilfred Lacy Other Clinician: Referring Malichi Palardy: Treating Julieta Rogalski/Extender:Robson, Gae Bon, Trilby Drummer in Treatment: 0 Activities of Daily Living Items Answer Activities of Daily Living (Please select one for each item) Drive Automobile Completely Able Take Medications Completely Able Use Telephone Completely Able Care for Appearance Completely Able Use Toilet Completely Able Bath / Shower Completely Able Dress Self Completely Able Feed Self Completely Able Walk Completely Able Get In / Out Bed Completely Able Housework Completely Able Prepare Meals Completely [place] Completely Able Shop for Self Completely Able Electronic Signature(s) Signed: 11/28/2019 5:51:44 PM By: Levan Hurst RN,  BSN Entered By: Levan Hurst on 11/22/2019 09:05:51 -------------------------------------------------------------------------------- Education Screening Details Patient Name: Date of Service: Bianca, [name] 11/22/2019 9:00 AM Medical Record [id number] Patient Account Number: [id number] Date of Birth/Sex: Treating RN: 10-05-1991 (28 y.o. Female) Levan Hurst Primary Care Kimeka Badour: Wilfred Lacy Other Clinician: Referring Mariya Mottley: Treating Alyx Mcguirk/Extender:Robson, Gae Bon, Trilby Drummer in Treatment: 0 Primary Learner Assessed: Patient Learning Preferences/Education Level/Primary Language Learning Preference: Explanation, Demonstration, Printed Material Highest Education Level: High School Preferred Language: English Cognitive Barrier Language Barrier: No Translator Needed: No Memory Deficit: No Emotional Barrier: No Cultural/Religious Beliefs Affecting Medical Care: No Physical Barrier Impaired Vision: No Impaired Hearing: No Decreased Hand dexterity: No Knowledge/Comprehension Knowledge Level: High Comprehension Level: High Ability to understand written High instructions: Ability to understand verbal High instructions: Motivation Anxiety Level: Calm Cooperation: Cooperative Education Importance: Acknowledges Need Interest in Health Problems: Asks Questions Perception: Coherent Willingness to Engage in Self- High Management Activities: Readiness to Engage in Self- High Management Activities: Electronic Signature(s) Signed: 11/28/2019 5:51:44 PM By: Levan Hurst RN, BSN Entered By: Levan Hurst on 11/22/2019 09:06:16 -------------------------------------------------------------------------------- Fall Risk Assessment Details Patient Name: Date of Service: Bianca, [name] 11/22/2019 9:00 AM Medical Record [id number] Patient Account Number: [id number] Date of Birth/Sex: Treating RN: 05/15/1991 (28 y.o. Female) Levan Hurst Primary Care Janeisha Ryle: Wilfred Lacy Other Clinician: Referring Mattisyn Cardona: Treating Marcy Sookdeo/Extender:Robson, Gae Bon, Trilby Drummer in Treatment: 0 Fall Risk Assessment Items Have you had 2 or more falls in the last 12 monthso 0 No Have you had any fall that resulted in injury in the last 12 monthso 0 No FALLS RISK SCREEN History of falling - immediate or within 3 months 0 No Secondary diagnosis (Do you have 2 or more medical diagnoseso) 0 No Ambulatory aid None/bed rest/wheelchair/nurse 0 Yes Crutches/cane/walker 0 No Furniture 0 No Intravenous therapy Access/Saline/Heparin Lock 0 No Weak (short steps with or without shuffle, stooped but able to lift head 0 No while walking, may seek support from furniture) Impaired (short steps with shuffle, may have  difficulty arising from chair, 0 No head down, impaired balance) Mental Status Oriented to own ability 0 Yes Overestimates or forgets limitations 0 No Risk Level: Low Risk Score: 0 Electronic Signature(s) Signed: 11/28/2019 5:51:44 PM By: [name] RN, BSN Entered By: [name] on 11/22/2019 09:06:39 -------------------------------------------------------------------------------- Foot Assessment Details Patient Name: Date of Service: Bianca, [name] 11/22/2019 9:00 AM Medical Record [id number] Patient Account Number: [id number] Date of Birth/Sex: Treating RN: 10-Mar-1991 (28 y.o. Female) [name] Primary Care Nosson Wender: [name] Other Clinician: Referring Domonic Kimball: Treating [place] Catarino/Extender:Robson, [name], [name] in Treatment: 0 Foot Assessment Items Site Locations + = Sensation present, - = Sensation absent, C = Callus, U = Ulcer R = Redness, W = Warmth, M = Maceration, PU = Pre-ulcerative lesion F = Fissure, S = Swelling, D = Dryness Assessment Right: Left: Other Deformity: No No Prior Foot Ulcer: No No Prior Amputation: No No Charcot Joint: No No Ambulatory  Status: Ambulatory Without Help Gait: Steady Electronic Signature(s) Signed: 11/28/2019 5:51:44 PM By: [name] RN, BSN Entered By: [name] on 11/22/2019 09:10:32 -------------------------------------------------------------------------------- Nutrition Risk Screening Details Patient Name: Date of Service: DEVONIA, FARRO 11/22/2019 9:00 AM Medical Record [id number] Patient Account Number: [id number] Date of Birth/Sex: Treating RN: January 02, 1991 (28 y.o. Female) [name] Primary Care Nel Stoneking: [name] Other Clinician: Referring Christene Pounds: Treating Tessla Spurling/Extender:Robson, [name], [name] in Treatment: 0 Height (in): 67 Weight (lbs): 260 Body Mass Index (BMI): 40.7 Nutrition Risk Screening Items Score Screening NUTRITION RISK SCREEN: I have an illness or condition that made me change the kind and/or 2 Yes amount of food I eat I eat fewer than two meals per day 0 No I eat few fruits and vegetables, or milk products 0 No I have three or more drinks of beer, liquor or wine almost every day 0 No I have tooth or mouth problems that make it hard for me to eat 0 No I don't always have enough money to buy the food I need 0 No I eat alone most of the time 0 No I take three or more different prescribed or over-the-counter drugs a day 0 No 0 No Without wanting to, I have lost or gained 10 pounds in the last six months I am not always physically able to shop, cook and/or feed myself 0 No Nutrition Protocols Good Risk Protocol Provide education on elevated blood sugars and Moderate Risk Protocol 0 impact on wound healing, as applicable High Risk Proctocol Risk Level: Good Risk Score: 2 Electronic Signature(s) Signed: 11/28/2019 5:51:44 PM By: [name] RN, BSN Entered By: [name] on 11/22/2019 09:07:55

## 2019-11-29 ENCOUNTER — Other Ambulatory Visit: Payer: Self-pay

## 2019-11-29 ENCOUNTER — Encounter (HOSPITAL_BASED_OUTPATIENT_CLINIC_OR_DEPARTMENT_OTHER): Payer: Self-pay | Admitting: Internal Medicine

## 2019-11-29 NOTE — Progress Notes (Addendum)
Bianca [name], Emmakate ([id number]) Visit Report for 11/29/2019 Arrival Information Details Patient Name: Date of Service: Bianca [name], Bianca [name] 11/29/2019 8:00 AM Medical Record [id number] Patient Account Number: [id number] Date of Birth/Sex: Treating RN: May 07, 1991 (28 y.o. Debara PickettF) Deaton, Millard.LoaBobbi Primary Care Saliou Barnier: Alysia PennaNche, [name] Other Clinician: Referring Gaelle Adriance: Treating Leeandra Ellerson/Extender:Robson, Bethann PunchesMichael [name], Lazarus Gowdaharlotte Weeks in Treatment: 1 Visit Information History Since Last Visit Added or deleted any medications: No Patient Arrived: Ambulatory Any new allergies or adverse reactions: No Arrival Time: 08:04 Had a fall or experienced change in No Accompanied By: self activities of daily living that may affect Transfer Assistance: None risk of falls: Patient Identification Verified: Yes Signs or symptoms of abuse/neglect since last No Secondary Verification Process Completed: Yes visito Patient Requires Transmission-Based No Hospitalized since last visit: No Precautions: Implantable device outside of the clinic excluding No Patient Has Alerts: No cellular tissue based products placed in the center since last visit: Has Dressing in Place as Prescribed: Yes Pain Present Now: No Electronic Signature(s) Signed: 11/29/2019 5:32:49 PM By: Shawn Stalleaton, Bobbi Entered By: Shawn Stalleaton, Bobbi on 11/29/2019 08:04:31 -------------------------------------------------------------------------------- Encounter Discharge Information Details Patient Name: Date of Service: Bianca [name], Bianca [name] 11/29/2019 8:00 AM Medical Record [id number] Patient Account Number: [id number] Date of Birth/Sex: Treating RN: May 07, 1991 (28 y.o. Arta SilenceF) Deaton, Bobbi Primary Care Johathan Province: Alysia PennaNche, [name] Other Clinician: Referring Marton Malizia: Treating Aleria Maheu/Extender:Robson, Bethann PunchesMichael [name], Lazarus Gowdaharlotte Weeks in Treatment: 1 Encounter Discharge Information Items Post Procedure Vitals Discharge Condition: Stable Temperature (F):  98 Ambulatory Status: Ambulatory Pulse (bpm): 87 Discharge Destination: Home Respiratory Rate (breaths/min): 20 Transportation: Private Auto Blood Pressure (mmHg): 134/84 Accompanied By: self Schedule Follow-up Appointment: Yes Clinical Summary of Care: Electronic Signature(s) Signed: 11/29/2019 5:32:49 PM By: Shawn Stalleaton, Bobbi Entered By: Shawn Stalleaton, Bobbi on 11/29/2019 09:00:18 -------------------------------------------------------------------------------- Lower Extremity Assessment Details Patient Name: Date of Service: Bianca [name], Bianca [name] 11/29/2019 8:00 AM Medical Record [id number] Patient Account Number: [id number] Date of Birth/Sex: Treating RN: May 07, 1991 (28 y.o. Arta SilenceF) Deaton, Bobbi Primary Care Librada Castronovo: Alysia PennaNche, [name] Other Clinician: Referring Jill Ruppe: Treating Davine Coba/Extender:Robson, Bethann PunchesMichael [name], Lazarus Gowdaharlotte Weeks in Treatment: 1 Edema Assessment Assessed: [Left: No] [Right: No] Edema: [Left: N] [Right: o] Calf Left: Right: Point of Measurement: 30 cm From Medial Instep 42.5 cm cm Ankle Left: Right: Point of Measurement: 10 cm From Medial Instep 24 cm cm Electronic Signature(s) Signed: 11/29/2019 5:32:49 PM By: Shawn Stalleaton, Bobbi Entered By: Shawn Stalleaton, Bobbi on 11/29/2019 08:12:45 -------------------------------------------------------------------------------- Multi Wound Chart Details Patient Name: Date of Service: Bianca [name], Bianca [name] 11/29/2019 8:00 AM Medical Record [id number] Patient Account Number: [id number] Date of Birth/Sex: Treating RN: May 07, 1991 (28 y.o. F) Primary Care Essa Malachi: Alysia PennaNche, [name] Other Clinician: Referring Johnpaul Gillentine: Treating Shemia Bevel/Extender:Robson, Bethann PunchesMichael [name], Lazarus Gowdaharlotte Weeks in Treatment: 1 Vital Signs Height(in): 67 Pulse(bpm): 87 Weight(lbs): 260 Blood Pressure(mmHg): 134/84 Body Mass Index(BMI): 41 Temperature(F): 98 Respiratory 20 Rate(breaths/min): Photos: [N/A:N/A] Wound Location: Left Foot - Dorsal N/A N/A Wounding  Event: Thermal Burn N/A N/A Primary Etiology: 3rd degree Burn N/A N/A Secondary Etiology: Diabetic Wound/Ulcer of the N/A N/A Lower Extremity Comorbid History: Sleep Apnea, Type II N/A N/A Diabetes, History of Burn, Neuropathy Date Acquired: 09/01/2019 N/A N/A Weeks of Treatment: 1 N/A N/A Wound Status: Open N/A N/A Measurements L x W x D 4x3.9x0.2 N/A N/A (cm) Area (cm) : 12.252 N/A N/A Volume (cm) : 2.45 N/A N/A % Reduction in Area: 2.50% N/A N/A % Reduction in Volume: -94.90% N/A N/A Classification: Full Thickness Without N/A N/A Exposed Support Structures Exudate Amount: Medium N/A N/A Exudate Type: Serosanguineous N/A N/A Exudate Color: red, brown N/A N/A Wound Margin: Flat and Intact  N/A N/A Granulation Amount: Large (67-100%) N/A N/A Granulation Quality: Red, Pink N/A N/A Necrotic Amount: Small (1-33%) N/A N/A Exposed Structures: Fat Layer (Subcutaneous N/A N/A Tissue) Exposed: Yes Fascia: No Tendon: No Muscle: No Joint: No Bone: No Epithelialization: Small (1-33%) N/A N/A Procedures Performed: Dressings and/or N/A N/A debridement of burns; small Treatment Notes Wound #1 (Left, Dorsal Foot) 1. Cleanse With Wound Cleanser 2. Periwound Care Skin Prep 3. Primary Dressing Applied Hydrofera Blue 4. Secondary Dressing Dry Gauze Foam Border Dressing 5. Secured With Self Adhesive Bandage Notes per patient declined the gauze and kerlix too bulky to wear in shoe. case manager and MD made aware. Electronic Signature(s) Signed: 12/05/2019 6:02:21 PM By: [name] RN, BSN Previous Signature: 11/29/2019 5:53:37 PM Version By: [name] MD Entered By: [name] on 12/02/2019 08:23:13 -------------------------------------------------------------------------------- Multi-Disciplinary Care Plan Details Patient Name: Date of Service: Bianca [name], Bianca [name] 11/29/2019 8:00 AM Medical Record [id number] Patient Account Number: [id number] Date of  Birth/Sex: Treating RN: 1991-01-29 (28 y.o. Freddy Finner Primary Care Angelo Caroll: [name] Other Clinician: Referring Ammi Hutt: Treating Jerriah Ines/Extender:Robson, [name], [name] in Treatment: 1 Active Inactive Wound/Skin Impairment Nursing Diagnoses: Knowledge deficit related to ulceration/compromised skin integrity Goals: Patient/caregiver will verbalize understanding of skin care regimen Date Initiated: 11/22/2019 Target Resolution Date: 12/23/2019 Goal Status: Active Ulcer/skin breakdown will have a volume reduction of 30% by week 4 Date Initiated: 11/22/2019 Target Resolution Date: 12/23/2019 Goal Status: Active Interventions: Assess patient/caregiver ability to obtain necessary supplies Assess patient/caregiver ability to perform ulcer/skin care regimen upon admission and as needed Assess ulceration(s) every visit Notes: Electronic Signature(s) Signed: 11/29/2019 2:52:36 PM By: Yevonne Pax RN Entered By: Yevonne Pax on 11/29/2019 07:59:37 -------------------------------------------------------------------------------- Pain Assessment Details Patient Name: Date of Service: Bianca [name], Bianca [name] 11/29/2019 8:00 AM Medical Record [id number] Patient Account Number: [id number] Date of Birth/Sex: Treating RN: 16-Jul-1991 (28 y.o. Arta Silence Primary Care Daxtin Leiker: [name] Other Clinician: Referring Genesee Nase: Treating Adison Reifsteck/Extender:Robson, [name], [name] in Treatment: 1 Active Problems Location of Pain Severity and Description of Pain Patient Has Paino No Site Locations Rate the pain. Current Pain Level: 0 Pain Management and Medication Current Pain Management: Medication: No Cold Application: No Rest: No Massage: No Activity: No T.E.N.S.: No Heat Application: No Leg drop or elevation: No Is the Current Pain Management Adequate: Inadequate How does your wound impact your activities of daily livingo Sleep:  No Bathing: No Appetite: No Relationship With Others: No Bladder Continence: No Emotions: No Bowel Continence: No Work: No Toileting: No Drive: No Dressing: No Hobbies: No Electronic Signature(s) Signed: 11/29/2019 5:32:49 PM By: Shawn Stall Entered By: Shawn Stall on 11/29/2019 08:06:31 -------------------------------------------------------------------------------- Patient/Caregiver Education Details Patient Name: Date of Service: Bianca [name], Bianca [name] 12/8/2020andnbsp8:00 AM Medical Record [id number] Patient Account Number: [id number] Date of Birth/Gender: Treating RN: Apr 29, 1991 (28 y.o. Freddy Finner Primary Care Physician: [name] Other Clinician: Referring Physician: Treating Physician/Extender:Robson, [name], [name] in Treatment: 1 Education Assessment Education Provided To: Patient Education Topics Provided Wound/Skin Impairment: Methods: Explain/Verbal Responses: State content correctly Electronic Signature(s) Signed: 11/29/2019 2:52:36 PM By: Yevonne Pax RN Entered By: Yevonne Pax on 11/29/2019 07:59:52 -------------------------------------------------------------------------------- Wound Assessment Details Patient Name: Date of Service: Bianca [name], Bianca [name] 11/29/2019 8:00 AM Medical Record [id number] Patient Account Number: [id number] Date of Birth/Sex: Treating RN: [birth year] (28 y.o. Arta Silence Primary Care Ashawna Hanback: [name] Other Clinician: Referring Amiya Escamilla: Treating Justise Ehmann/Extender:Robson, [name], [name] in Treatment: 1 Wound Status Wound Number: 1 Primary 3rd degree Burn Etiology: Wound Location: Left Foot -  Dorsal Secondary Diabetic Wound/Ulcer of the Lower Wounding Event: Thermal Burn Etiology: Extremity Date Acquired: 09/01/2019 Wound Open Weeks Of Treatment: 1 Status: Clustered Wound: No Comorbid Sleep Apnea, Type II Diabetes, History of History: Burn,  Neuropathy Photos Wound Measurements Length: (cm) 4 % Reduct Width: (cm) 3.9 % Reduct Depth: (cm) 0.2 Epitheli Area: (cm) 12.252 Tunneli Volume: (cm) 2.45 Undermi Wound Description Full Thickness Without Exposed Support Foul Od Classification: Structures Slough/ Wound Flat and Intact Margin: Exudate Medium Amount: Exudate Serosanguineous Type: Exudate red, brown Color: Wound Bed Granulation Amount: Large (67-100%) Granulation Quality: Red, Pink Fascia E Necrotic Amount: Small (1-33%) Fat Laye Necrotic Quality: Adherent Slough Tendon E Muscle E Joint Ex Bone Exp or After Cleansing: No Fibrino Yes Exposed Structure xposed: No r (Subcutaneous Tissue) Exposed: Yes xposed: No xposed: No posed: No osed: No ion in Area: 2.5% ion in Volume: -94.9% alization: Small (1-33%) ng: No ning: No Treatment Notes Wound #1 (Left, Dorsal Foot) 1. Cleanse With Wound Cleanser 2. Periwound Care Skin Prep 3. Primary Dressing Applied Hydrofera Blue 4. Secondary Dressing Dry Gauze Foam Border Dressing 5. Secured With Self Adhesive Bandage Notes per patient declined the gauze and kerlix too bulky to wear in shoe. case manager and MD made aware. Electronic Signature(s) Signed: 12/02/2019 6:25:32 PM By: Deon Pilling Signed: 12/05/2019 6:02:21 PM By: Levan Hurst RN, BSN Previous Signature: 11/30/2019 11:25:39 AM Version By: Mikeal Hawthorne EMT/HBOT Previous Signature: 11/30/2019 11:25:39 AM Version By: Mikeal Hawthorne EMT/HBOT Previous Signature: 11/30/2019 12:05:21 PM Version By: Deon Pilling Previous Signature: 11/29/2019 5:32:49 PM Version By: Deon Pilling Entered By: Levan Hurst on 12/02/2019 08:21:13 -------------------------------------------------------------------------------- Vitals Details Patient Name: Date of Service: Bianca [name], Bianca [name] 11/29/2019 8:00 AM Medical Record [id number] Patient Account Number: [id number] Date of Birth/Sex: Treating  RN: November 21, 1991 (28 y.o. Helene Shoe, Meta.Reding Primary Care Hania Cerone: Wilfred Lacy Other Clinician: Referring Krew Hortman: Treating Calvert Charland/Extender:Robson, Gae Bon, Trilby Drummer in Treatment: 1 Vital Signs Time Taken: 08:05 Temperature (F): 98 Height (in): 67 Pulse (bpm): 87 Weight (lbs): 260 Respiratory Rate (breaths/min): 20 Body Mass Index (BMI): 40.7 Blood Pressure (mmHg): 134/84 Reference Range: 80 - 120 mg / dl Electronic Signature(s) Signed: 11/29/2019 5:32:49 PM By: Deon Pilling Entered By: Deon Pilling on 11/29/2019 08:07:03
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
## 2019-12-06 ENCOUNTER — Other Ambulatory Visit: Payer: Self-pay

## 2019-12-06 ENCOUNTER — Encounter (HOSPITAL_BASED_OUTPATIENT_CLINIC_OR_DEPARTMENT_OTHER): Payer: Self-pay | Admitting: Internal Medicine
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
## 2019-12-08 NOTE — Progress Notes (Signed)
Bianca [name], Bianca [name] ([id number]) Visit Report for 12/06/2019 Arrival Information Details Patient Name: Date of Service: Bianca [name], Bianca [name] 12/06/2019 8:00 AM Medical Record [id number] Patient Account Number: [id number] Date of Birth/Sex: Treating RN: May 28, 1991 (28 y.o. Wynelle Link Primary Care Udell Blasingame: [name] Other Clinician: Referring Val Farnam: Treating Eulises Kijowski/Extender:Robson, [name], [name] in Treatment: 2 Visit Information History Since Last Visit Added or deleted any medications: No Patient Arrived: Ambulatory Any new allergies or adverse reactions: No Arrival Time: 07:59 Had a fall or experienced change in No Accompanied By: alone activities of daily living that may affect Transfer Assistance: None risk of falls: Patient Identification Verified: Yes Signs or symptoms of abuse/neglect since last No Secondary Verification Process Completed: Yes visito Patient Requires Transmission-Based No Hospitalized since last visit: No Precautions: Implantable device outside of the clinic excluding No Patient Has Alerts: No cellular tissue based products placed in the center since last visit: Has Dressing in Place as Prescribed: Yes Pain Present Now: No Electronic Signature(s) Signed: 12/08/2019 5:32:06 PM By: [name] RN, BSN Entered By: [name] on 12/06/2019 07:59:42 -------------------------------------------------------------------------------- Clinic Level of Care Assessment Details Patient Name: Date of Service: Bianca [name], Bianca [name] 12/06/2019 8:00 AM Medical Record [id number] Patient Account Number: [id number] Date of Birth/Sex: Treating RN: 1991/08/28 (28 y.o. Freddy Finner Primary Care Declan Adamson: [name] Other Clinician: Referring Horrace Hanak: Treating Randon Somera/Extender:Robson, [name], [name] in Treatment: 2 Clinic Level of Care Assessment Items TOOL 4 Quantity Score X - Use when only an EandM is  performed on FOLLOW-UP visit 1 0 ASSESSMENTS - Nursing Assessment / Reassessment X - Reassessment of Co-morbidities (includes updates in patient status) 1 10 X - Reassessment of Adherence to Treatment Plan 1 5 ASSESSMENTS - Wound and Skin Assessment / Reassessment X - Simple Wound Assessment / Reassessment - one wound 1 5 []  - Complex Wound Assessment / Reassessment - multiple wounds 0 []  - Dermatologic / Skin Assessment (not related to wound area) 0 ASSESSMENTS - Focused Assessment []  - Circumferential Edema Measurements - multi extremities 0 []  - Nutritional Assessment / Counseling / Intervention 0 []  - Lower Extremity Assessment (monofilament, tuning fork, pulses) 0 []  - Peripheral Arterial Disease Assessment (using hand held doppler) 0 ASSESSMENTS - Ostomy and/or Continence Assessment and Care []  - Incontinence Assessment and Management 0 []  - Ostomy Care Assessment and Management (repouching, etc.) 0 PROCESS - Coordination of Care X - Simple Patient / Family Education for ongoing care 1 15 []  - Complex (extensive) Patient / Family Education for ongoing care 0 X - Staff obtains , Records, Test Results / Process Orders 1 10 []  - Staff telephones HHA, Nursing Homes / Clarify orders / etc 0 []  - Routine Transfer to another Facility (non-emergent condition) 0 []  - Routine Hospital Admission (non-emergent condition) 0 []  - New Admissions / / Ordering NPWT, Apligraf, etc. 0 []  - Emergency Hospital Admission (emergent condition) 0 X - Simple Discharge Coordination 1 10 []  - Complex (extensive) Discharge Coordination 0 PROCESS - Special Needs []  - Pediatric / Minor Patient Management 0 []  - Isolation Patient Management 0 []  - Hearing / Language / Visual special needs 0 []  - Assessment of Community assistance (transportation, D/C planning, etc.) 0 []  - Additional assistance / Altered mentation 0 []  - Support Surface(s) Assessment (bed, cushion, seat, etc.)  0 INTERVENTIONS - Wound Cleansing / Measurement X - Simple Wound Cleansing - one wound 1 5 []  - Complex Wound Cleansing - multiple wounds 0 X - Wound Imaging (photographs - any  number of wounds) 1 5 []  - Wound Tracing (instead of photographs) 0 X - Simple Wound Measurement - one wound 1 5 []  - Complex Wound Measurement - multiple wounds 0 INTERVENTIONS - Wound Dressings []  - Small Wound Dressing one or multiple wounds 0 X - Medium Wound Dressing one or multiple wounds 1 15 []  - Large Wound Dressing one or multiple wounds 0 X - Application of Medications - topical 1 5 []  - Application of Medications - injection 0 INTERVENTIONS - Miscellaneous []  - External ear exam 0 []  - Specimen Collection (cultures, biopsies, blood, body fluids, etc.) 0 []  - Specimen(s) / Culture(s) sent or taken to Lab for analysis 0 []  - Patient Transfer (multiple staff / Nurse, adultHoyer Lift / Similar devices) 0 []  - Simple Staple / Suture removal (25 or less) 0 []  - Complex Staple / Suture removal (26 or more) 0 []  - Hypo / Hyperglycemic Management (close monitor of Blood Glucose) 0 []  - Ankle / Brachial Index (ABI) - do not check if billed separately 0 X - Vital Signs 1 5 Has the patient been seen at the hospital within the last three years: Yes Total Score: 95 Level Of Care: New/Established - Level 3 Electronic Signature(s) Signed: 12/07/2019 9:49:12 AM By: [name], [name] Entered By: [name], [name] on 12/06/2019 08:42:46 -------------------------------------------------------------------------------- Encounter Discharge Information Details Patient Name: Date of Service: Bianca [name], Bianca [name] 12/06/2019 8:00 AM Medical Record [id number] Patient Account Number: [id number] Date of Birth/Sex: Treating RN: [birth year] (28 y.o. Wynelle LinkF) Lynch, Shatara Primary Care Atleigh Gruen: Alysia PennaNche, [name] Other Clinician: Referring Shayna Eblen: Treating Bryanne Riquelme/Extender:Robson, Bethann PunchesMichael [name], Lazarus Gowdaharlotte Weeks in Treatment: 2 Encounter  Discharge Information Items Discharge Condition: Stable Ambulatory Status: Ambulatory Discharge Destination: Home Transportation: Private Auto Accompanied By: alone Schedule Follow-up Appointment: Yes Clinical Summary of Care: Patient Declined Electronic Signature(s) Signed: 12/08/2019 5:32:06 PM By: Zandra AbtsLynch, Shatara RN, BSN Entered By: Zandra AbtsLynch, Shatara on 12/06/2019 08:43:18 -------------------------------------------------------------------------------- Lower Extremity Assessment Details Patient Name: Date of Service: Bianca [name], Bianca [name] 12/06/2019 8:00 AM Medical Record [id number] Patient Account Number: [id number] Date of Birth/Sex: Treating RN: [birth year] (28 y.o. Wynelle LinkF) Lynch, Shatara Primary Care Martinique Pizzimenti: Alysia PennaNche, [name] Other Clinician: Referring Detron Carras: Treating Lovie Agresta/Extender:Robson, Bethann PunchesMichael [name], Lazarus Gowdaharlotte Weeks in Treatment: 2 Edema Assessment Assessed: [Left: No] [Right: No] Edema: [Left: N] [Right: o] Calf Left: Right: Point of Measurement: 30 cm From Medial Instep 42.5 cm cm Ankle Left: Right: Point of Measurement: 10 cm From Medial Instep 24 cm cm Vascular Assessment Pulses: Dorsalis Pedis Palpable: [Left:Yes] Electronic Signature(s) Signed: 12/08/2019 5:32:06 PM By: Zandra AbtsLynch, Shatara RN, BSN Entered By: Zandra AbtsLynch, Shatara on 12/06/2019 08:02:05 -------------------------------------------------------------------------------- Multi Wound Chart Details Patient Name: Date of Service: Bianca [name], Bianca [name] 12/06/2019 8:00 AM Medical Record [id number] Patient Account Number: [id number] Date of Birth/Sex: Treating RN: [birth year] (28 y.o. F) Primary Care Jovan Schickling: Alysia PennaNche, [name] Other Clinician: Referring Rozetta Stumpp: Treating Earsie Humm/Extender:Robson, Bethann PunchesMichael [name], Lazarus Gowdaharlotte Weeks in Treatment: 2 Vital Signs Height(in): 67 Pulse(bpm): 79 Weight(lbs): 260 Blood Pressure(mmHg): 138/73 Body Mass Index(BMI): 41 Temperature(F): 98.2 Respiratory  16 Rate(breaths/min): Photos: [1:No Photos] [N/A:N/A] Wound Location: [1:Left Foot - Dorsal] [N/A:N/A] Wounding Event: [1:Thermal Burn] [N/A:N/A] Primary Etiology: [1:3rd degree Burn] [N/A:N/A] Secondary Etiology: [1:Diabetic Wound/Ulcer of the N/A Lower Extremity] Comorbid History: [1:Sleep Apnea, Type II Diabetes, History of Burn, Neuropathy] [N/A:N/A] Date Acquired: [1:09/01/2019] [N/A:N/A] Weeks of Treatment: [1:2] [N/A:N/A] Wound Status: [1:Open] [N/A:N/A] Measurements L x W x D 3.9x3.5x0.2 [N/A:N/A] (cm) Area (cm) : [1:10.721] [N/A:N/A] Volume (cm) : [1:2.144] [N/A:N/A] % Reduction in Area: [1:14.70%] [N/A:N/A] % Reduction in Volume: -70.60% [N/A:N/A] Classification: [1:Full  Thickness Without Exposed Support Structures] [N/A:N/A] Exudate Amount: [1:Medium] [N/A:N/A] Exudate Type: [1:Serosanguineous] [N/A:N/A] Exudate Color: [1:red, brown] [N/A:N/A] Wound Margin: [1:Flat and Intact] [N/A:N/A] Granulation Amount: [1:Large (67-100%)] [N/A:N/A] Granulation Quality: [1:Red, Pink] [N/A:N/A] Necrotic Amount: [1:Small (1-33%)] [N/A:N/A] Exposed Structures: [1:Fat Layer (Subcutaneous N/A Tissue) Exposed: Yes Fascia: No Tendon: No Muscle: No Joint: No Bone: No Small (1-33%)] [N/A:N/A] Treatment Notes Wound #1 (Left, Dorsal Foot) 1. Cleanse With Wound Cleanser 3. Primary Dressing Applied Hydrofera Blue 4. Secondary Dressing Foam Border Dressing Electronic Signature(s) Signed: 12/06/2019 5:53:46 PM By: Linton Ham MD Entered By: Linton Ham on 12/06/2019 09:02:42 -------------------------------------------------------------------------------- Multi-Disciplinary Care Plan Details Patient Name: Date of Service: Bianca [name], Bianca [name] 12/06/2019 8:00 AM Medical Record [id number] Patient Account Number: [id number] Date of Birth/Sex: Treating RN: 07-19-1991 (28 y.o. Orvan Falconer Primary Care Giuseppe Duchemin: Wilfred Lacy Other Clinician: Referring Keylan Costabile: Treating  Mikyla Schachter/Extender:Robson, Gae Bon, Trilby Drummer in Treatment: 2 Active Inactive Wound/Skin Impairment Nursing Diagnoses: Knowledge deficit related to ulceration/compromised skin integrity Goals: Patient/caregiver will verbalize understanding of skin care regimen Date Initiated: 11/22/2019 Target Resolution Date: 12/23/2019 Goal Status: Active Ulcer/skin breakdown will have a volume reduction of 30% by week 4 Date Initiated: 11/22/2019 Target Resolution Date: 12/23/2019 Goal Status: Active Interventions: Assess patient/caregiver ability to obtain necessary supplies Assess patient/caregiver ability to perform ulcer/skin care regimen upon admission and as needed Assess ulceration(s) every visit Notes: Electronic Signature(s) Signed: 12/07/2019 9:49:12 AM By: Carlene Coria RN Entered By: Carlene Coria on 12/06/2019 08:06:55 -------------------------------------------------------------------------------- Pain Assessment Details Patient Name: Date of Service: Bianca [name], Bianca [name] 12/06/2019 8:00 AM Medical Record [id number] Patient Account Number: [id number] Date of Birth/Sex: Treating RN: [birth year] (28 y.o. Nancy Fetter Primary Care Mardene Lessig: Wilfred Lacy Other Clinician: Referring Xavius Spadafore: Treating Zuriel Yeaman/Extender:Robson, Gae Bon, Trilby Drummer in Treatment: 2 Active Problems Location of Pain Severity and Description of Pain Patient Has Paino No Site Locations Pain Management and Medication Current Pain Management: Electronic Signature(s) Signed: 12/08/2019 5:32:06 PM By: Levan Hurst RN, BSN Entered By: Levan Hurst on 12/06/2019 07:59:55 -------------------------------------------------------------------------------- Patient/Caregiver Education Details Patient Name: Date of Service: Bianca [name], Bianca [name] 12/15/2020andnbsp8:00 AM Medical Record [id number] Patient Account Number: [id number] Date of Birth/Gender: 06-Nov-1991 (28 y.o. F) Treating  RN: Carlene Coria Primary Care Physician: Wilfred Lacy Other Clinician: Referring Physician: Treating Physician/Extender:Robson, Gae Bon, Trilby Drummer in Treatment: 2 Education Assessment Education Provided To: Patient Education Topics Provided Pain: Methods: Explain/Verbal Responses: State content correctly Electronic Signature(s) Signed: 12/07/2019 9:49:12 AM By: Carlene Coria RN Entered By: Carlene Coria on 12/06/2019 08:07:12 -------------------------------------------------------------------------------- Wound Assessment Details Patient Name: Date of Service: Bianca [name], Bianca [name] 12/06/2019 8:00 AM Medical Record [id number] Patient Account Number: [id number] Date of Birth/Sex: Treating RN: 1991-06-17 (28 y.o. Nancy Fetter Primary Care Nettie Wyffels: Wilfred Lacy Other Clinician: Referring Cathlene Gardella: Treating Lajoyce Tamura/Extender:Robson, Gae Bon, Trilby Drummer in Treatment: 2 Wound Status Wound Number: 1 Primary 3rd degree Burn Etiology: Wound Location: Left Foot - Dorsal Secondary Diabetic Wound/Ulcer of the Lower Wounding Event: Thermal Burn Etiology: Extremity Date Acquired: 09/01/2019 Wound Open Weeks Of Treatment: 2 Status: Clustered Wound: No Comorbid Sleep Apnea, Type II Diabetes, History of History: Burn, Neuropathy Photos Wound Measurements Length: (cm) 3.9 % Reduc Width: (cm) 3.5 % Reduct Depth: (cm) 0.2 Epitheli Area: (cm) 10.721 Tunneli Volume: (cm) 2.144 Undermi Wound Description Classification: Full Thickness Without Exposed Support Foul Od Structures Slough/ Wound Flat and Intact Margin: Exudate Medium Amount: Exudate Serosanguineous Type: Exudate red, brown Color: Wound Bed Granulation Amount: Large (67-100%) Granulation Quality: Red, Pink Fascia Necrotic Amount: Small (1-33%) Fat Lay Necrotic Quality: Adherent  Slough Tendon Muscle Joint E Bone Ex or After Cleansing: No Fibrino Yes Exposed Structure Exposed:  No er (Subcutaneous Tissue) Exposed: Yes Exposed: No Exposed: No xposed: No posed: No tion in Area: 14.7% ion in Volume: -70.6% alization: Small (1-33%) ng: No ning: No Treatment Notes Wound #1 (Left, Dorsal Foot) 1. Cleanse With Wound Cleanser 3. Primary Dressing Applied Hydrofera Blue 4. Secondary Dressing Foam Border Dressing Electronic Signature(s) Signed: 12/07/2019 3:47:19 PM By: Benjaman Kindler EMT/HBOT Signed: 12/08/2019 5:32:06 PM By: [name] RN, BSN Entered By: Benjaman Kindler on 12/07/2019 11:53:46 -------------------------------------------------------------------------------- Vitals Details Patient Name: Date of Service: Bianca [name], Bianca [name] 12/06/2019 8:00 AM Medical Record [id number] Patient Account Number: [id number] Date of Birth/Sex: Treating RN: 04-09-1991 (28 y.o. Wynelle Link Primary Care Dovie Kapusta: [name] Other Clinician: Referring Weylyn Ricciuti: Treating Rina Adney/Extender:Robson, [name], [name] in Treatment: 2 Vital Signs Time Taken: 08:00 Temperature (F): 98.2 Height (in): 67 Pulse (bpm): 79 Weight (lbs): 260 Respiratory Rate (breaths/min): 16 Body Mass Index (BMI): 40.7 Blood Pressure (mmHg): 138/73 Reference Range: 80 - 120 mg / dl Electronic Signature(s) Signed: 12/08/2019 5:32:06 PM By: [name] RN, BSN Entered By: [name] on 12/06/2019 08:01:57

## 2019-12-20 ENCOUNTER — Encounter (HOSPITAL_BASED_OUTPATIENT_CLINIC_OR_DEPARTMENT_OTHER): Payer: Self-pay | Admitting: Internal Medicine

## 2019-12-20 ENCOUNTER — Other Ambulatory Visit: Payer: Self-pay
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
## 2019-12-26 NOTE — Progress Notes (Signed)
Bianca [name] ([id number]) Visit Report for 12/20/2019 Arrival Information Details Patient Name: Date of Service: Bianca [name], Bianca [name] 12/20/2019 8:00 AM Medical Record [id number] Patient Account Number: [id number] Date of Birth/Sex: Treating RN: [birth year] (29 y.o. Harvest Dark Primary Care Arsema Tusing: [name] Other Clinician: Referring Gethsemane Fischler: Treating Shaqueta Casady/Extender:Robson, [name], [name] in Treatment: 4 Visit Information History Since Last Visit Added or deleted any medications: No Patient Arrived: Ambulatory Any new allergies or adverse reactions: No Arrival Time: 08:14 Had a fall or experienced change in No Accompanied By: self activities of daily living that may affect Transfer Assistance: None risk of falls: Patient Identification Verified: Yes Signs or symptoms of abuse/neglect since last No Secondary Verification Process Completed: Yes visito Patient Requires Transmission-Based No Hospitalized since last visit: No Precautions: Implantable device outside of the clinic excluding No Patient Has Alerts: No cellular tissue based products placed in the center since last visit: Has Dressing in Place as Prescribed: Yes Pain Present Now: No Electronic Signature(s) Signed: 12/26/2019 4:32:10 PM By: Cherylin Mylar Entered By: Cherylin Mylar on 12/20/2019 08:15:05 -------------------------------------------------------------------------------- Clinic Level of Care Assessment Details Patient Name: Date of Service: Bianca [name] 12/20/2019 8:00 AM Medical Record [id number] Patient Account Number: [id number] Date of Birth/Sex: Treating RN: August 27, 1991 (29 y.o. Freddy Finner Primary Care Beryl Hornberger: [name] Other Clinician: Referring Jalee Saine: Treating Mohmed Farver/Extender:Robson, [name], [name] in Treatment: 4 Clinic Level of Care Assessment Items TOOL 4 Quantity Score X - Use when only an EandM is  performed on FOLLOW-UP visit 1 0 ASSESSMENTS - Nursing Assessment / Reassessment X - Reassessment of Co-morbidities (includes updates in patient status) 1 10 X - Reassessment of Adherence to Treatment Plan 1 5 ASSESSMENTS - Wound and Skin Assessment / Reassessment X - Simple Wound Assessment / Reassessment - one wound 1 5 []  - Complex Wound Assessment / Reassessment - multiple wounds 0 []  - Dermatologic / Skin Assessment (not related to wound area) 0 ASSESSMENTS - Focused Assessment []  - Circumferential Edema Measurements - multi extremities 0 []  - Nutritional Assessment / Counseling / Intervention 0 []  - Lower Extremity Assessment (monofilament, tuning fork, pulses) 0 []  - Peripheral Arterial Disease Assessment (using hand held doppler) 0 ASSESSMENTS - Ostomy and/or Continence Assessment and Care []  - Incontinence Assessment and Management 0 []  - Ostomy Care Assessment and Management (repouching, etc.) 0 PROCESS - Coordination of Care X - Simple Patient / Family Education for ongoing care 1 15 []  - Complex (extensive) Patient / Family Education for ongoing care 0 X - Staff obtains , Records, Test Results / Process Orders 1 10 []  - Staff telephones HHA, Nursing Homes / Clarify orders / etc 0 []  - Routine Transfer to another Facility (non-emergent condition) 0 []  - Routine Hospital Admission (non-emergent condition) 0 []  - New Admissions / / Ordering NPWT, Apligraf, etc. 0 []  - Emergency Hospital Admission (emergent condition) 0 X - Simple Discharge Coordination 1 10 []  - Complex (extensive) Discharge Coordination 0 PROCESS - Special Needs []  - Pediatric / Minor Patient Management 0 []  - Isolation Patient Management 0 []  - Hearing / Language / Visual special needs 0 []  - Assessment of Community assistance (transportation, D/C planning, etc.) 0 []  - Additional assistance / Altered mentation 0 []  - Support Surface(s) Assessment (bed, cushion, seat, etc.)  0 INTERVENTIONS - Wound Cleansing / Measurement X - Simple Wound Cleansing - one wound 1 5 []  - Complex Wound Cleansing - multiple wounds 0 X - Wound Imaging (photographs - any number of  wounds) 1 5 []  - Wound Tracing (instead of photographs) 0 X - Simple Wound Measurement - one wound 1 5 []  - Complex Wound Measurement - multiple wounds 0 INTERVENTIONS - Wound Dressings []  - Small Wound Dressing one or multiple wounds 0 X - Medium Wound Dressing one or multiple wounds 1 15 []  - Large Wound Dressing one or multiple wounds 0 X - Application of Medications - topical 1 5 []  - Application of Medications - injection 0 INTERVENTIONS - Miscellaneous []  - External ear exam 0 []  - Specimen Collection (cultures, biopsies, blood, body fluids, etc.) 0 []  - Specimen(s) / Culture(s) sent or taken to Lab for analysis 0 []  - Patient Transfer (multiple staff / / Similar devices) 0 []  - Simple Staple / Suture removal (25 or less) 0 []  - Complex Staple / Suture removal (26 or more) 0 []  - Hypo / Hyperglycemic Management (close monitor of Blood Glucose) 0 []  - Ankle / Brachial Index (ABI) - do not check if billed separately 0 X - Vital Signs 1 5 Has the patient been seen at the hospital within the last three years: Yes Total Score: 95 Level Of Care: New/Established - Level 3 Electronic Signature(s) Signed: 12/20/2019 5:43:27 PM By: RN Entered By: on 12/20/2019 08:45:55 -------------------------------------------------------------------------------- Encounter Discharge Information Details Patient Name: Date of Service: Bianca [name] 12/20/2019 8:00 AM Medical Record Patient Account Number: Date of Birth/Sex: Treating RN: December 20, 1991 (29 y.o. Primary Care Tomiko Schoon: Other Clinician: Referring Jader Desai: Treating Jhane Lorio/Extender:Robson, , in Treatment: 4 Encounter  Discharge Information Items Discharge Condition: Stable Ambulatory Status: Ambulatory Discharge Destination: Home Transportation: Private Auto Accompanied By: alone Schedule Follow-up Appointment: Yes Clinical Summary of Care: Patient Declined Electronic Signature(s) Signed: 12/22/2019 3:10:57 PM By: Yevonne Pax RN, BSN Entered By: Yevonne Pax on 12/20/2019 08:44:13 -------------------------------------------------------------------------------- Lower Extremity Assessment Details Patient Name: Date of Service: TAMIAH, DYSART 12/20/2019 8:00 AM Medical Record [id number] Patient Account Number: [id number] Date of Birth/Sex: Treating RN: 1991-06-12 (28 y.o. Wynelle Link Primary Care Halston Kintz: [name] Other Clinician: Referring Meggan Dhaliwal: Treating Liberti Appleton/Extender:Robson, [name], [name] in Treatment: 4 Edema Assessment Assessed: [Left: No] [Right: No] Edema: [Left: N] [Right: o] Calf Left: Right: Point of Measurement: 30 cm From Medial Instep 41 cm cm Ankle Left: Right: Point of Measurement: 10 cm From Medial Instep 24.7 cm cm Vascular Assessment Pulses: Dorsalis Pedis Palpable: [Left:Yes] Electronic Signature(s) Signed: 12/26/2019 4:32:10 PM By: [name] Entered By: [name] on 12/20/2019 08:17:56 -------------------------------------------------------------------------------- Multi Wound Chart Details Patient Name: Date of Service: IVERNA, HAMMAC 12/20/2019 8:00 AM Medical Record [id number] Patient Account Number: [id number] Date of Birth/Sex: Treating RN: [birth year] (28 y.o. F) Primary Care Tyla Burgner: Harvest Dark Other Clinician: Referring Sabas Frett: Treating Ashwin Tibbs/Extender:Robson, [name], [name] in Treatment: 4 Vital Signs Height(in): 67 Pulse(bpm): 79 Weight(lbs): 260 Blood Pressure(mmHg): 135/87 Body Mass Index(BMI): 41 Temperature(F): 98.3 Respiratory  17 Rate(breaths/min): Photos: [1:No Photos] [N/A:N/A] Wound Location: [1:Left Foot - Dorsal] [N/A:N/A] Wounding Event: [1:Thermal Burn] [N/A:N/A] Primary Etiology: [1:3rd degree Burn] [N/A:N/A] Secondary Etiology: [1:Diabetic Wound/Ulcer of the N/A Lower Extremity] Comorbid History: [1:Sleep Apnea, Type II Diabetes, History of Burn, Neuropathy] [N/A:N/A] Date Acquired: [1:09/01/2019] [N/A:N/A] Weeks of Treatment: [1:4] [N/A:N/A] Wound Status: [1:Open] [N/A:N/A] Measurements L x W x D 3.1x2.8x0.1 [N/A:N/A] (cm) Area (cm) : [1:6.817] [N/A:N/A] Volume (cm) : [1:0.682] [N/A:N/A] % Reduction in Area: [1:45.80%] [N/A:N/A] % Reduction in Volume: 45.70% [N/A:N/A] Classification: [1:Full Thickness Without Exposed Support  Structures] [N/A:N/A] Exudate Amount: [1:Medium] [N/A:N/A] Exudate Type: [1:Serosanguineous] [N/A:N/A] Exudate Color: [1:red, brown] [N/A:N/A] Wound Margin: [1:Flat and Intact] [N/A:N/A] Granulation Amount: [1:Large (67-100%)] [N/A:N/A] Granulation Quality: [1:Red, Pink] [N/A:N/A] Necrotic Amount: [1:None Present (0%)] [N/A:N/A] Exposed Structures: [1:Fat Layer (Subcutaneous N/A Tissue) Exposed: Yes Fascia: No Tendon: No Muscle: No Joint: No Bone: No Medium (34-66%)] [N/A:N/A] Treatment Notes Wound #1 (Left, Dorsal Foot) 1. Cleanse With Wound Cleanser 3. Primary Dressing Applied Hydrofera Blue 4. Secondary Dressing Foam Border Dressing Notes foam border per patient request Electronic Signature(s) Signed: 12/20/2019 6:15:30 PM By: [name] MD Entered By: [name] on 12/20/2019 08:47:03 -------------------------------------------------------------------------------- Multi-Disciplinary Care Plan Details Patient Name: Date of Service: BETHSAIDA, SIEGENTHALER 12/20/2019 8:00 AM Medical Record [id number] Patient Account Number: [id number] Date of Birth/Sex: Treating RN: 1991-09-14 (28 y.o. Freddy Finner Primary Care Luvinia Lucy: [name] Other  Clinician: Referring Burna Atlas: Treating Rondell Pardon/Extender:Robson, [name], [name] in Treatment: 4 Active Inactive Wound/Skin Impairment Nursing Diagnoses: Knowledge deficit related to ulceration/compromised skin integrity Goals: Patient/caregiver will verbalize understanding of skin care regimen Date Initiated: 11/22/2019 Target Resolution Date: 12/23/2019 Goal Status: Active Ulcer/skin breakdown will have a volume reduction of 30% by week 4 Date Initiated: 11/22/2019 Target Resolution Date: 12/23/2019 Goal Status: Active Interventions: Assess patient/caregiver ability to obtain necessary supplies Assess patient/caregiver ability to perform ulcer/skin care regimen upon admission and as needed Assess ulceration(s) every visit Notes: Electronic Signature(s) Signed: 12/20/2019 5:43:27 PM By: Yevonne Pax RN Entered By: Yevonne Pax on 12/20/2019 08:20:22 -------------------------------------------------------------------------------- Pain Assessment Details Patient Name: Date of Service: OPHELIA, SIPE 12/20/2019 8:00 AM Medical Record [id number] Patient Account Number: [id number] Date of Birth/Sex: Treating RN: 10-Jul-1991 (28 y.o. Harvest Dark Primary Care Chanee Henrickson: [name] Other Clinician: Referring Tanelle Lanzo: Treating Ardis Lawley/Extender:Robson, [name], [name] in Treatment: 4 Active Problems Location of Pain Severity and Description of Pain Patient Has Paino No Site Locations Pain Management and Medication Current Pain Management: Electronic Signature(s) Signed: 12/26/2019 4:32:10 PM By: Cherylin Mylar Entered By: Cherylin Mylar on 12/20/2019 08:16:53 -------------------------------------------------------------------------------- Patient/Caregiver Education Details Patient Name: Date of Service: THELIA, TANKSLEY 12/29/2020andnbsp8:00 AM Medical Record [id number] Patient Account Number: [id number] Date of  Birth/Gender: Treating RN: 07-Nov-1991 (28 y.o. Freddy Finner Primary Care Physician: [name] Other Clinician: Referring Physician: Treating Physician/Extender:Robson, [name], [name] in Treatment: 4 Education Assessment Education Provided To: Patient Education Topics Provided Pain: Methods: Explain/Verbal Responses: State content correctly Electronic Signature(s) Signed: 12/20/2019 5:43:27 PM By: Yevonne Pax RN Entered By: Yevonne Pax on 12/20/2019 08:20:41 -------------------------------------------------------------------------------- Wound Assessment Details Patient Name: Date of Service: MARIUM, RAGAN 12/20/2019 8:00 AM Medical Record [id number] Patient Account Number: [id number] Date of Birth/Sex: Treating RN: 12/11/1991 (28 y.o. Harvest Dark Primary Care Rockney Grenz: [name] Other Clinician: Referring Marcell Chavarin: Treating Jocelyne Reinertsen/Extender:Robson, [name], [name] in Treatment: 4 Wound Status Wound Number: 1 Primary 3rd degree Burn Etiology: Wound Location: Left Foot - Dorsal Secondary Diabetic Wound/Ulcer of the Lower Wounding Event: Thermal Burn Etiology: Extremity Date Acquired: 09/01/2019 Wound Open Weeks Of Treatment: 4 Status: Clustered Wound: No Comorbid Sleep Apnea, Type II Diabetes, History of History: Burn, Neuropathy Photos Wound Measurements Length: (cm) 3.1 % Redu Width: (cm) 2.8 % Redu Depth: (cm) 0.1 Epithe Area: (cm) 6.817 Tunne Volume: (cm) 0.682 Under Wound Description Classification: Full Thickness Without Exposed Support Foul Structures Sloug Wound Flat and Intact Margin: Exudate Medium Amount: Exudate Serosanguineous Type: Exudate red, brown Color: Wound Bed Granulation Amount: Large (67-100%) Granulation Quality: Red, Pink Fascia Necrotic Amount: None Present (0%) Fat La Tendon Muscle  Joint Bone E Odor After Cleansing: No h/Fibrino No Exposed  Structure Exposed: No yer (Subcutaneous Tissue) Exposed: Yes Exposed: No Exposed: No Exposed: No xposed: No ction in Area: 45.8% ction in Volume: 45.7% lialization: Medium (34-66%) ling: No mining: No Treatment Notes Wound #1 (Left, Dorsal Foot) 1. Cleanse With Wound Cleanser 3. Primary Dressing Applied Hydrofera Blue 4. Secondary Dressing Foam Border Dressing Notes foam border per patient request Electronic Signature(s) Signed: 12/21/2019 3:39:15 PM By: Mikeal Hawthorne EMT/HBOT Signed: 12/26/2019 4:32:10 PM By: Kela Millin Entered By: Mikeal Hawthorne on 12/21/2019 14:40:06 -------------------------------------------------------------------------------- Vitals Details Patient Name: Date of Service: GRACIN, SOOHOO 12/20/2019 8:00 AM Medical Record [id number] Patient Account Number: [id number] Date of Birth/Sex: Treating RN: 1991-09-26 (29 y.o. Clearnce Sorrel Primary Care Ester Hilley: Wilfred Lacy Other Clinician: Referring Coraima Tibbs: Treating Mamadou Breon/Extender:Robson, Gae Bon, Trilby Drummer in Treatment: 4 Vital Signs Time Taken: 08:15 Temperature (F): 98.3 Height (in): 67 Pulse (bpm): 79 Weight (lbs): 260 Respiratory Rate (breaths/min): 17 Body Mass Index (BMI): 40.7 Blood Pressure (mmHg): 135/87 Reference Range: 80 - 120 mg / dl Electronic Signature(s) Signed: 12/26/2019 4:32:10 PM By: Kela Millin Entered By: Kela Millin on 12/20/2019 08:16:46

## 2020-01-03 ENCOUNTER — Other Ambulatory Visit: Payer: Self-pay

## 2020-01-03 ENCOUNTER — Encounter (HOSPITAL_BASED_OUTPATIENT_CLINIC_OR_DEPARTMENT_OTHER): Payer: Self-pay | Admitting: Internal Medicine

## 2020-01-03 DIAGNOSIS — E1142 Type 2 diabetes mellitus with diabetic polyneuropathy: Secondary | ICD-10-CM | POA: Insufficient documentation

## 2020-01-03 DIAGNOSIS — T25322A Burn of third degree of left foot, initial encounter: Secondary | ICD-10-CM | POA: Insufficient documentation

## 2020-01-03 DIAGNOSIS — E11621 Type 2 diabetes mellitus with foot ulcer: Secondary | ICD-10-CM | POA: Insufficient documentation

## 2020-01-03 DIAGNOSIS — I10 Essential (primary) hypertension: Secondary | ICD-10-CM | POA: Insufficient documentation

## 2020-01-03 DIAGNOSIS — L97522 Non-pressure chronic ulcer of other part of left foot with fat layer exposed: Secondary | ICD-10-CM | POA: Insufficient documentation

## 2020-01-03 NOTE — Progress Notes (Addendum)
Bianca, [name] ([id number]) Visit Report for 01/03/2020 Arrival Information Details Patient Name: Date of Service: Bianca, [name] 01/03/2020 9:00 AM Medical Record [id number] Patient Account Number: [id number] Date of Birth/Sex: Treating RN: [birth year] (29 y.o. Bianca [name] Primary Care Bianca [name]: Bianca [name] Other Clinician: Referring Bianca [name]: Treating Bianca [name]/Extender:Bianca [name], Bianca [name], Bianca [name] in Treatment: 6 Visit Information History Since Last Visit All ordered tests and consults were completed: No Patient Arrived: Ambulatory Added or deleted any medications: No Arrival Time: 08:49 Any new allergies or adverse reactions: No Accompanied By: self Had a fall or experienced change in No Transfer Assistance: None activities of daily living that may affect Patient Identification Verified: Yes risk of falls: Secondary Verification Process Completed: Yes Signs or symptoms of abuse/neglect since last No Patient Requires Transmission-Based No visito Precautions: Hospitalized since last visit: No Patient Has Alerts: No Implantable device outside of the clinic excluding No cellular tissue based products placed in the center since last visit: Has Dressing in Place as Prescribed: Yes Pain Present Now: No Electronic Signature(s) Signed: 01/03/2020 6:18:05 PM By: Bianca Coria RN Entered By: Bianca [name] on 01/03/2020 08:50:13 -------------------------------------------------------------------------------- Clinic Level of Care Assessment Details Patient Name: Date of Service: Bianca, [name] 01/03/2020 9:00 AM Medical Record [id number] Patient Account Number: [id number] Date of Birth/Sex: Treating RN: [name] (29 y.o. Bianca [name] Primary Care Bianca [name]: Bianca [name] Other Clinician: Referring Bianca [name]: Treating Bianca [name]/Extender:Bianca [name], Bianca [name], Bianca [name] in Treatment: 6 Clinic Level of Care Assessment Items TOOL 4 Quantity  Score X - Use when only an EandM is performed on FOLLOW-UP visit 1 0 ASSESSMENTS - Nursing Assessment / Reassessment X - Reassessment of Co-morbidities (includes updates in patient status) 1 10 X - Reassessment of Adherence to Treatment Plan 1 5 ASSESSMENTS - Wound and Skin Assessment / Reassessment X - Simple Wound Assessment / Reassessment - one wound 1 5 '[]'$  - Complex Wound Assessment / Reassessment - multiple wounds 0 '[]'$  - Dermatologic / Skin Assessment (not related to wound area) 0 ASSESSMENTS - Focused Assessment '[]'$  - Circumferential Edema Measurements - multi extremities 0 '[]'$  - Nutritional Assessment / Counseling / Intervention 0 '[]'$  - Lower Extremity Assessment (monofilament, tuning fork, pulses) 0 '[]'$  - Peripheral Arterial Disease Assessment (using hand held doppler) 0 ASSESSMENTS - Ostomy and/or Continence Assessment and Care '[]'$  - Incontinence Assessment and Management 0 '[]'$  - Ostomy Care Assessment and Management (repouching, etc.) 0 PROCESS - Coordination of Care X - Simple Patient / Family Education for ongoing care 1 15 '[]'$  - Complex (extensive) Patient / Family Education for ongoing care 0 X - Staff obtains Programmer, systems, Records, Test Results / Process Orders 1 10 '[]'$  - Staff telephones HHA, Nursing Homes / Clarify orders / etc 0 '[]'$  - Routine Transfer to another Facility (non-emergent condition) 0 '[]'$  - Routine Hospital Admission (non-emergent condition) 0 '[]'$  - New Admissions / Biomedical engineer / Ordering NPWT, Apligraf, etc. 0 '[]'$  - Emergency Hospital Admission (emergent condition) 0 X - Simple Discharge Coordination 1 10 '[]'$  - Complex (extensive) Discharge Coordination 0 PROCESS - Special Needs '[]'$  - Pediatric / Minor Patient Management 0 '[]'$  - Isolation Patient Management 0 '[]'$  - Hearing / Language / Visual special needs 0 '[]'$  - Assessment of Community assistance (transportation, D/C planning, etc.) 0 '[]'$  - Additional assistance / Altered mentation 0 '[]'$  - Support Surface(s)  Assessment (bed, cushion, seat, etc.) 0 INTERVENTIONS - Wound Cleansing / Measurement X - Simple Wound Cleansing - one wound 1 5 '[]'$  - Complex Wound Cleansing - multiple wounds 0  X - Wound Imaging (photographs - any number of wounds) 1 5 '[]'$  - Wound Tracing (instead of photographs) 0 X - Simple Wound Measurement - one wound 1 5 '[]'$  - Complex Wound Measurement - multiple wounds 0 INTERVENTIONS - Wound Dressings '[]'$  - Small Wound Dressing one or multiple wounds 0 X - Medium Wound Dressing one or multiple wounds 1 15 '[]'$  - Large Wound Dressing one or multiple wounds 0 '[]'$  - Application of Medications - topical 0 '[]'$  - Application of Medications - injection 0 INTERVENTIONS - Miscellaneous '[]'$  - External ear exam 0 '[]'$  - Specimen Collection (cultures, biopsies, blood, body fluids, etc.) 0 '[]'$  - Specimen(s) / Culture(s) sent or taken to Lab for analysis 0 '[]'$  - Patient Transfer (multiple staff / Civil Service fast streamer / Similar devices) 0 '[]'$  - Simple Staple / Suture removal (25 or less) 0 '[]'$  - Complex Staple / Suture removal (26 or more) 0 '[]'$  - Hypo / Hyperglycemic Management (close monitor of Blood Glucose) 0 '[]'$  - Ankle / Brachial Index (ABI) - do not check if billed separately 0 X - Vital Signs 1 5 Has the patient been seen at the hospital within the last three years: Yes Total Score: 90 Level Of Care: New/Established - Level 3 Electronic Signature(s) Signed: 01/03/2020 6:18:05 PM By: Bianca Coria RN Entered By: Bianca [name] on 01/03/2020 09:36:47 -------------------------------------------------------------------------------- Lower Extremity Assessment Details Patient Name: Date of Service: Bianca, [name] 01/03/2020 9:00 AM Medical Record [id number] Patient Account Number: [id number] Date of Birth/Sex: Treating RN: Bianca [name] (29 y.o. Bianca [name] Primary Care Masin Shatto: Bianca [name] Other Clinician: Referring Karita Dralle: Treating Campbell Kray/Extender:Bianca [name], Bianca [name], Bianca [name] in  Treatment: 6 Edema Assessment Assessed: [Left: No] [Right: No] Edema: [Left: N] [Right: o] Calf Left: Right: Point of Measurement: 30 cm From Medial Instep 41 cm cm Ankle Left: Right: Point of Measurement: 10 cm From Medial Instep 24.7 cm cm Vascular Assessment Pulses: Dorsalis Pedis Palpable: [Left:Yes] Electronic Signature(s) Signed: 01/03/2020 6:18:44 PM By: Levan Hurst RN, BSN Entered By: Levan Hurst on 01/03/2020 08:56:44 -------------------------------------------------------------------------------- Multi Wound Chart Details Patient Name: Date of Service: Bianca, [name] 01/03/2020 9:00 AM Medical Record [id number] Patient Account Number: [id number] Date of Birth/Sex: Treating RN: [name] (29 y.o. F) Primary Care Jerauld Bostwick: Bianca [name] Other Clinician: Referring Takenya Travaglini: Treating Sylina Henion/Extender:Bianca [name], Bianca [name], Bianca [name] in Treatment: 6 Vital Signs Height(in): 67 Pulse(bpm): 85 Weight(lbs): 260 Blood Pressure(mmHg): 147/78 Body Mass Index(BMI): 41 Temperature(F): 97.9 Respiratory 18 Rate(breaths/min): Photos: [1:No Photos] [N/A:N/A] Wound Location: [1:Left Foot - Dorsal] [N/A:N/A] Wounding Event: [1:Thermal Burn] [N/A:N/A] Primary Etiology: [1:3rd degree Burn] [N/A:N/A] Secondary Etiology: [1:Diabetic Wound/Ulcer of the N/A Lower Extremity] Comorbid History: [1:Sleep Apnea, Type II Diabetes, History of Burn, Neuropathy] [N/A:N/A] Date Acquired: [1:09/01/2019] [N/A:N/A] Weeks of Treatment: [1:6] [N/A:N/A] Wound Status: [1:Open] [N/A:N/A] Measurements L x W x D 2.1x2x0.1 [N/A:N/A] (cm) Area (cm) : [1:3.299] [N/A:N/A] Volume (cm) : [1:0.33] [N/A:N/A] % Reduction in Area: [1:73.70%] [N/A:N/A] % Reduction in Volume: 73.70% [N/A:N/A] Classification: [1:Full Thickness Without Exposed Support Structures] [N/A:N/A] Exudate Amount: [1:Medium] [N/A:N/A] Exudate Type: [1:Serosanguineous] [N/A:N/A] Exudate Color: [1:red, brown]  [N/A:N/A] Wound Margin: [1:Flat and Intact] [N/A:N/A] Granulation Amount: [1:Large (67-100%)] [N/A:N/A] Granulation Quality: [1:Red] [N/A:N/A] Necrotic Amount: [1:None Present (0%)] [N/A:N/A] Exposed Structures: [1:Fat Layer (Subcutaneous N/A Tissue) Exposed: Yes Fascia: No Tendon: No Muscle: No Joint: No Bone: No Medium (34-66%)] [N/A:N/A] Treatment Notes Electronic Signature(s) Signed: 01/03/2020 6:16:53 PM By: Linton Ham MD Entered By: Linton Ham on 01/03/2020 09:37:32 -------------------------------------------------------------------------------- Multi-Disciplinary Care Plan Details Patient Name: Date of Service: Bianca [name],  Bianca [name] 01/03/2020 9:00 AM Medical Record [id number] Patient Account Number: [id number] Date of Birth/Sex: Treating RN: [name] (29 y.o. Bianca [name] Primary Care Augusta Mirkin: Bianca [name] Other Clinician: Referring Laaibah Wartman: Treating Anela Bensman/Extender:Bianca [name], Bianca [name], Bianca [name] in Treatment: 6 Active Inactive Wound/Skin Impairment Nursing Diagnoses: Knowledge deficit related to ulceration/compromised skin integrity Goals: Patient/caregiver will verbalize understanding of skin care regimen Date Initiated: 11/22/2019 Target Resolution Date: 01/27/2020 Goal Status: Active Ulcer/skin breakdown will have a volume reduction of 30% by week 4 Date Initiated: 11/22/2019 Date Inactivated: 01/03/2020 Target Resolution Date: 12/23/2019 Goal Status: Met Ulcer/skin breakdown will have a volume reduction of 50% by week 8 Date Initiated: 01/03/2020 Target Resolution Date: 01/27/2020 Goal Status: Active Interventions: Assess patient/caregiver ability to obtain necessary supplies Assess patient/caregiver ability to perform ulcer/skin care regimen upon admission and as needed Assess ulceration(s) every visit Notes: Electronic Signature(s) Signed: 01/03/2020 6:18:05 PM By: Bianca Coria RN Entered By: Bianca [name] on 01/03/2020  08:40:31 -------------------------------------------------------------------------------- Pain Assessment Details Patient Name: Date of Service: TAWNIA, SCHIRM 01/03/2020 9:00 AM Medical Record [id number] Patient Account Number: [id number] Date of Birth/Sex: Treating RN: [birth year] (29 y.o. Bianca [name] Primary Care Christen Wardrop: Bianca [name] Other Clinician: Referring Shawndell Varas: Treating Arville Postlewaite/Extender:Bianca [name], Bianca [name], Bianca [name] in Treatment: 6 Active Problems Location of Pain Severity and Description of Pain Patient Has Paino No Site Locations Pain Management and Medication Current Pain Management: Electronic Signature(s) Signed: 01/03/2020 6:18:44 PM By: Levan Hurst RN, BSN Entered By: Levan Hurst on 01/03/2020 08:54:46 -------------------------------------------------------------------------------- Patient/Caregiver Education Details Patient Name: Date of Service: Bianca, [name] 1/12/2021andnbsp9:00 AM Medical Record [id number] Patient Account Number: [id number] Date of Birth/Gender: Treating RN: [name] (29 y.o. Bianca [name] Primary Care Physician: Bianca [name] Other Clinician: Referring Physician: Treating Physician/Extender:Bianca [name], Bianca [name], Bianca [name] in Treatment: 6 Education Assessment Education Provided To: Patient Education Topics Provided Wound/Skin Impairment: Methods: Explain/Verbal Responses: State content correctly Electronic Signature(s) Signed: 01/03/2020 6:18:05 PM By: Bianca Coria RN Entered By: Bianca [name] on 01/03/2020 08:40:45 -------------------------------------------------------------------------------- Wound Assessment Details Patient Name: Date of Service: Bianca, [name] 01/03/2020 9:00 AM Medical Record [id number] Patient Account Number: [id number] Date of Birth/Sex: Treating RN: [name] (29 y.o. Bianca [name] Primary Care Kyilee Gregg: Bianca [name] Other  Clinician: Referring Ayyub Krall: Treating Sheilia Reznick/Extender:Bianca [name], Bianca [name], Bianca [name] in Treatment: 6 Wound Status Wound Number: 1 Primary 3rd degree Burn Etiology: Wound Location: Left Foot - Dorsal Secondary Diabetic Wound/Ulcer of the Lower Wounding Event: Thermal Burn Etiology: Extremity Date Acquired: 09/01/2019 Wound Open Weeks Of Treatment: 6 Status: Clustered Wound: No Comorbid Sleep Apnea, Type II Diabetes, History of History: Burn, Neuropathy Photos Wound Measurements Length: (cm) 2.1 % Reduct Width: (cm) 2 % Reduct Depth: (cm) 0.1 Epitheli Area: (cm) 3.299 Tunneli Volume: (cm) 0.33 Undermi Wound Description Classification: Full Thickness Without Exposed Support Foul Od Structures Slough/ Wound Flat and Intact Margin: Exudate Medium Amount: Exudate Serosanguineous Type: Exudate red, brown Color: Wound Bed Granulation Amount: Large (67-100%) Granulation Quality: Red Fascia E Necrotic Amount: None Present (0%) Fat Laye Tendon E Muscle E Joint Expo Bone Expos or After Cleansing: No Fibrino No Exposed Structure xposed: No r (Subcutaneous Tissue) Exposed: Yes xposed: No xposed: No sed: No ed: No ion in Area: 73.7% ion in Volume: 73.7% alization: Medium (34-66%) ng: No ning: No Electronic Signature(s) Signed: 01/04/2020 3:30:11 PM By: Mikeal Hawthorne EMT/HBOT Signed: 01/05/2020 6:31:26 PM By: Levan Hurst RN, BSN Previous Signature: 01/03/2020 6:18:44 PM Version By: Levan Hurst RN, BSN Entered By: Mikeal Hawthorne on 01/04/2020 11:20:28 -------------------------------------------------------------------------------- Vitals Details Patient Name: Date of  Service: Bianca, [name] 01/03/2020 9:00 AM Medical Record [id number] Patient Account Number: [id number] Date of Birth/Sex: Treating RN: [name] (29 y.o. Bianca [name] Primary Care Jennette Leask: Bianca [name] Other Clinician: Referring Uilani Sanville: Treating  Avleen Bordwell/Extender:Bianca [name], Bianca [name], Bianca [name] in Treatment: 6 Vital Signs Time Taken: 08:50 Temperature (F): 97.9 Height (in): 67 Pulse (bpm): 85 Weight (lbs): 260 Respiratory Rate (breaths/min): 18 Body Mass Index (BMI): 40.7 Blood Pressure (mmHg): 147/78 Reference Range: 80 - 120 mg / dl Electronic Signature(s) Signed: 01/03/2020 6:18:05 PM By: Bianca Coria RN Entered By: Bianca [name] on 01/03/2020 08:52:04
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
## 2020-01-10 ENCOUNTER — Encounter (HOSPITAL_BASED_OUTPATIENT_CLINIC_OR_DEPARTMENT_OTHER): Payer: Self-pay | Admitting: Internal Medicine

## 2020-01-17 ENCOUNTER — Encounter (HOSPITAL_BASED_OUTPATIENT_CLINIC_OR_DEPARTMENT_OTHER): Payer: Self-pay | Attending: Internal Medicine | Admitting: Internal Medicine

## 2020-04-05 ENCOUNTER — Other Ambulatory Visit: Payer: Self-pay

## 2020-04-05 ENCOUNTER — Encounter (HOSPITAL_COMMUNITY): Payer: Self-pay

## 2020-04-05 ENCOUNTER — Ambulatory Visit (HOSPITAL_COMMUNITY)
Admission: EM | Admit: 2020-04-05 | Discharge: 2020-04-05 | Disposition: A | Discharge status: Home / Self Care | Payer: Medicaid Other | Attending: Emergency Medicine | Admitting: Emergency Medicine

## 2020-04-05 DIAGNOSIS — L600 Ingrowing nail: Secondary | ICD-10-CM

## 2020-04-05 NOTE — ED Provider Notes (Signed)
[facility]    CSN: [id number] Arrival date & time: 04/05/20  1915      History   Chief Complaint Chief Complaint  Patient presents with  . Toe Infection    HPI Bianca [name] is a 29 y.o. female.   Pt is here for rt big toe pain after pulling at toe nail today. States that it is red and painful. Pt is a diabetic, not taking medications due to her insurance for job does not kick in for 3 months. Denies any fevers. Has not taken anything pta      Past Medical History:  Diagnosis Date  . Diabetes mellitus without complication ([place])   . Hyperlipidemia     Patient Active Problem List   Diagnosis Date Noted  . Type 2 diabetes mellitus with diabetic polyneuropathy, without long-term current use of insulin ([place]) 09/20/2019  . Pure hypertriglyceridemia 09/20/2019  . Open wound of left foot excluding one or more toes 09/20/2019  . Diabetic polyneuropathy associated with type 2 diabetes mellitus ([place]) 09/20/2019  . Tobacco use 09/20/2019    History reviewed. No pertinent surgical history.  OB History   No obstetric history on file.      Home Medications    Prior to Admission medications   Medication Sig Start Date End Date Taking? Authorizing Provider  blood glucose meter kit and supplies KIT Dispense based on patient and insurance preference. Check glucose before breakfast and at bedtime. ICD 10 code: E11.42 09/20/19   [name], Charlene Brooke, NP  cephALEXin (KEFLEX) 500 MG capsule Take 1 capsule (500 mg total) by mouth 2 (two) times daily. 09/20/19   [name], Charlene Brooke, NP  Dulaglutide (TRULICITY) 8.54 OE/7.0JJ SOPN Inject 0.75 mg into the skin once a week. 09/20/19   [name], Charlene Brooke, NP  fenofibrate (TRICOR) 145 MG tablet Take 1 tablet (145 mg total) by mouth daily. 09/23/19   [name], Charlene Brooke, NP  metFORMIN (GLUCOPHAGE) 500 MG tablet Take 1 tablet (500 mg total) by mouth 2 (two) times daily with a meal. 09/20/19   [name], Charlene Brooke, NP  omega-3 acid  ethyl esters (LOVAZA) 1 g capsule Take 1 capsule (1 g total) by mouth 2 (two) times daily. 09/20/19   [name], Charlene Brooke, NP  silver sulfADIAZINE (SILVADENE) 1 % cream Apply 1 application topically 2 (two) times daily. Patient not taking: Reported on 09/20/2019 09/02/19   Sharion Balloon, NP    Family History Family History  Problem Relation Age of Onset  . Cancer Mother        breast cancer  . Healthy Father   . Diabetes Maternal Grandmother   . Heart disease Maternal Grandmother   . Stroke Maternal Grandmother     Social History Social History   Tobacco Use  . Smoking status: Current Every Day Smoker    Packs/day: 0.50    Types: Cigarettes  . Smokeless tobacco: Never Used  Substance Use Topics  . Alcohol use: Never  . Drug use: Never     Allergies   Patient has no known allergies.   Review of Systems Review of Systems  Respiratory: Negative.   Cardiovascular: Negative.   Musculoskeletal:       Rt toe pain   Skin:       Redness to rt big to toenail   Neurological: Negative.      Physical Exam Triage Vital Signs ED Triage Vitals  Enc Vitals Group     BP 04/05/20 1941 (!) 145/87  Pulse Rate 04/05/20 1941 92     Resp 04/05/20 1941 17     Temp 04/05/20 1941 98.4 F (36.9 C)     Temp Source 04/05/20 1941 Oral     SpO2 04/05/20 1941 98 %     Weight --      Height --      Head Circumference --      Peak Flow --      Pain Score 04/05/20 1944 5     Pain Loc --      Pain Edu? --      Excl. in [place]? --    No data found.  Updated Vital Signs BP (!) 145/87 (BP Location: Right Arm)   Pulse 92   Temp 98.4 F (36.9 C) (Oral)   Resp 17   SpO2 98%   Visual Acuity     Physical Exam Constitutional:      Appearance: She is obese.  Cardiovascular:     Pulses: Normal pulses.  Pulmonary:     Effort: Pulmonary effort is normal.  Musculoskeletal:        General: Swelling and tenderness present.     Comments: RT big toe, erythema noted to tip area and near  boarder of nail. Does have a calcus to poterior side of toe. No drainage, no open wound. Strong pulses warm   Skin:    General: Skin is warm.     Capillary Refill: Capillary refill takes less than 2 seconds.     Findings: Erythema present.  Neurological:     General: No focal deficit present.     Mental Status: She is alert.      UC Treatments / Results  Labs (all labs ordered are listed, but only abnormal results are displayed) Labs Reviewed - No data to display  EKG   Radiology No results found.  Procedures Procedures (including critical care time)  Medications Ordered in UC Medications - No data to display  Initial Impression / Assessment and Plan / UC Course  I have reviewed the triage vital signs and the nursing notes.  Pertinent labs & imaging results that were available during my care of the patient were reviewed by me and considered in my medical decision making (see chart for details).     Will need to see a podiatry  Do not cut nails Take full dose of medications  If toe is not healed you may need to return to cont medications Will refer to get a pcp  Final Clinical Impressions(s) / UC Diagnoses   Final diagnoses:  Ingrown nail of great toe of right foot     Discharge Instructions     Will need to see a podiatry  Do not cut nails Take full dose of medications  If toe is not healed you may need to return to cont medications Will refer to get a pcp     ED Prescriptions    None     PDMP not reviewed this encounter.   Marney Setting, NP 04/05/20 2008

## 2020-04-05 NOTE — Discharge Instructions (Addendum)
Will need to see a podiatry  Do not cut nails Take full dose of medications  If toe is not healed you may need to return to cont medications Will refer to get a pcp

## 2020-04-05 NOTE — ED Triage Notes (Signed)
Pt presents with infection to right great tow from pulling out ingrown toenail.  Pt is diabetic.

## 2020-04-06 ENCOUNTER — Telehealth (HOSPITAL_COMMUNITY): Payer: Self-pay | Admitting: Family Medicine

## 2020-04-06 ENCOUNTER — Other Ambulatory Visit: Payer: Self-pay | Admitting: Nurse Practitioner

## 2020-04-06 DIAGNOSIS — E1142 Type 2 diabetes mellitus with diabetic polyneuropathy: Secondary | ICD-10-CM

## 2020-04-06 MED ORDER — CEPHALEXIN 500 MG PO CAPS: 500.0000 mg | ORAL_CAPSULE | Freq: Three times a day (TID) | ORAL | 0 refills | Status: DC

## 2020-04-06 NOTE — Telephone Encounter (Signed)
Patient states Rx not sent to pharmacy.  Will send Cephalexin for toe infection.  Chart reviewed.  YSN

## 2020-04-10 ENCOUNTER — Ambulatory Visit (HOSPITAL_COMMUNITY)
Admission: EM | Admit: 2020-04-10 | Discharge: 2020-04-10 | Disposition: A | Discharge status: Home / Self Care | Payer: Medicaid Other | Attending: Urgent Care | Admitting: Urgent Care

## 2020-04-10 ENCOUNTER — Other Ambulatory Visit: Payer: Self-pay

## 2020-04-10 DIAGNOSIS — M79674 Pain in right toe(s): Secondary | ICD-10-CM

## 2020-04-10 DIAGNOSIS — L03031 Cellulitis of right toe: Secondary | ICD-10-CM

## 2020-04-10 DIAGNOSIS — E1165 Type 2 diabetes mellitus with hyperglycemia: Secondary | ICD-10-CM

## 2020-04-10 MED ORDER — DOXYCYCLINE HYCLATE 100 MG PO CAPS: 100.0000 mg | ORAL_CAPSULE | Freq: Two times a day (BID) | ORAL | 0 refills | Status: DC

## 2020-04-10 MED ORDER — NAPROXEN 500 MG PO TABS: 500.0000 mg | ORAL_TABLET | Freq: Two times a day (BID) | ORAL | 0 refills | Status: AC

## 2020-04-10 NOTE — ED Provider Notes (Signed)
[facility]   MRN: [id number] DOB: 03/17/1991  Subjective:   Bianca [name] is a 29 y.o. female presenting for recheck on right great toe pain.  Patient was last seen here on 04/05/2020, was started on Keflex for infection of her toe.  Patient has been on the antibiotic, has worsened reporting more redness and swelling.  She now notices some redness of the next toe.  She has uncontrolled diabetes as is only taking her Metformin right now.  She just got her insurance and is trying to establish care.  Denies fever, nausea, vomiting.  Patient admits she does not have good sensation of her feet.  She does feel some mild pain however.  No current facility-administered medications for this encounter.  Current Outpatient Medications:  .  blood glucose meter kit and supplies KIT, Dispense based on patient and insurance preference. Check glucose before breakfast and at bedtime. ICD 10 code: E11.42, Disp: 1 each, Rfl: 0 .  cephALEXin (KEFLEX) 500 MG capsule, Take 1 capsule (500 mg total) by mouth 3 (three) times daily., Disp: 21 capsule, Rfl: 0 .  Dulaglutide (TRULICITY) 8.67 RJ/7.3GK SOPN, Inject 0.75 mg into the skin once a week., Disp: 4 pen, Rfl: 2 .  fenofibrate (TRICOR) 145 MG tablet, Take 1 tablet (145 mg total) by mouth daily., Disp: 30 tablet, Rfl: 5 .  metFORMIN (GLUCOPHAGE) 500 MG tablet, Take 1 tablet (500 mg total) by mouth 2 (two) times daily with a meal., Disp: 60 tablet, Rfl: 2 .  omega-3 acid ethyl esters (LOVAZA) 1 g capsule, Take 1 capsule (1 g total) by mouth 2 (two) times daily., Disp: 60 capsule, Rfl: 2 .  silver sulfADIAZINE (SILVADENE) 1 % cream, Apply 1 application topically 2 (two) times daily. (Patient not taking: Reported on 09/20/2019), Disp: 85 g, Rfl: 1   No Known Allergies  Past Medical History:  Diagnosis Date  . Diabetes mellitus without complication ([place])   . Hyperlipidemia      No past surgical history on file.  Family History  Problem Relation Age  of Onset  . Cancer Mother        breast cancer  . Healthy Father   . Diabetes Maternal Grandmother   . Heart disease Maternal Grandmother   . Stroke Maternal Grandmother     Social History   Tobacco Use  . Smoking status: Current Every Day Smoker    Packs/day: 0.50    Types: Cigarettes  . Smokeless tobacco: Never Used  Substance Use Topics  . Alcohol use: Never  . Drug use: Never    ROS   Objective:   Vitals: BP (!) 148/81 (BP Location: Left Arm)   Pulse 85   Temp 98.9 F (37.2 C) (Oral)   Resp 18   SpO2 99%   Physical Exam Constitutional:      General: She is not in acute distress.    Appearance: Normal appearance. She is well-developed. She is not ill-appearing, toxic-appearing or diaphoretic.  HENT:     Head: Normocephalic and atraumatic.     Nose: Nose normal.     Mouth/Throat:     Mouth: Mucous membranes are moist.     Pharynx: Oropharynx is clear.  Eyes:     General: No scleral icterus.    Extraocular Movements: Extraocular movements intact.     Pupils: Pupils are equal, round, and reactive to light.  Cardiovascular:     Rate and Rhythm: Normal rate.  Pulmonary:     Effort: Pulmonary effort is  normal.  Musculoskeletal:       Feet:  Skin:    General: Skin is warm and dry.  Neurological:     General: No focal deficit present.     Mental Status: She is alert and oriented to person, place, and time.  Psychiatric:        Mood and Affect: Mood normal.        Behavior: Behavior normal.        Thought Content: Thought content normal.        Judgment: Judgment normal.         PROCEDURE NOTE: I&D of Abscess Verbal consent obtained. Local anesthesia with 2cc of 2% lidocaine without epinephrine. Site cleansed with Betadine.  Paronychia was reduced along side of her lateral nail extending to the proximal cuticle.  Make sure of purulence and serosanguineous fluid drained.  Cleansed and dressed.   Assessment and Plan :   PDMP not reviewed this  encounter.  1. Acute paronychia of toe, right   2. Pain of right great toe   3. Uncontrolled type 2 diabetes mellitus with hyperglycemia (HCC)   4. Cellulitis of great toe, right     Successful I&D of paronychia.  Stop Keflex.  Start doxycycline.  Use naproxen for pain and inflammation.  Wound care reviewed.  Advised that we should obtain labs but patient declined due to the wait time.  I subsequently canceled the orders.  Counseled patient on potential for adverse effects with medications prescribed/recommended today, ER and return-to-clinic precautions discussed, patient verbalized understanding.    Jaynee Eagles, [place] 04/10/20 [phone]

## 2020-04-10 NOTE — Discharge Instructions (Addendum)
Please change your dressing 2-3 times daily. Do not apply any ointments or creams. Each time you change your dressing, make sure you clean gently around the perimeter of the wound with gentle soap and soak in warm water. Make sure you apply pressure and work out the pus and infection doing the soaks.

## 2020-04-10 NOTE — ED Triage Notes (Signed)
Pt presents to UC for follow up for her right big toe. Pt states she was seen at this UC 5 days ago, for pain, swelling and redness in her right big toe after she pulled out the nail. Pt states the right big toe is worse. Pt states she is taking cephalexin x 4 days.

## 2020-04-18 ENCOUNTER — Other Ambulatory Visit: Payer: Self-pay

## 2020-04-18 ENCOUNTER — Emergency Department (HOSPITAL_COMMUNITY)
Admission: EM | Admit: 2020-04-18 | Discharge: 2020-04-19 | Disposition: A | Discharge status: Home / Self Care | Payer: Medicaid Other | Attending: Emergency Medicine | Admitting: Emergency Medicine

## 2020-04-18 ENCOUNTER — Encounter (HOSPITAL_COMMUNITY): Payer: Self-pay | Admitting: *Deleted

## 2020-04-18 DIAGNOSIS — Z5321 Procedure and treatment not carried out due to patient leaving prior to being seen by health care provider: Secondary | ICD-10-CM | POA: Insufficient documentation

## 2020-04-18 DIAGNOSIS — M79676 Pain in unspecified toe(s): Secondary | ICD-10-CM | POA: Insufficient documentation

## 2020-04-18 LAB — CBG MONITORING, ED: Glucose-Capillary: 225 mg/dL — ABNORMAL HIGH (ref 70–99)

## 2020-04-18 NOTE — ED Triage Notes (Signed)
To ED for eval of bilateral great toe pain. States she has been seen and treated at a couple of urgent cares over the past few weeks for ingrown toenails. States she is on doxy and was told to soak her feet but due to her work schedule she isn't able to. Pt is a diabetic - on oral meds but unable to take cbg at home due to lack of funds. Min pain to toes - pt states she may have neuropathy. Redness and white filled areas noted to both toes around nail.

## 2020-04-19 ENCOUNTER — Emergency Department (HOSPITAL_COMMUNITY): Payer: Self-pay

## 2020-04-19 ENCOUNTER — Other Ambulatory Visit: Payer: Self-pay

## 2020-04-19 ENCOUNTER — Emergency Department (HOSPITAL_COMMUNITY)
Admission: EM | Admit: 2020-04-19 | Discharge: 2020-04-19 | Disposition: A | Discharge status: Home / Self Care | Payer: Self-pay | Attending: Emergency Medicine | Admitting: Emergency Medicine

## 2020-04-19 DIAGNOSIS — E119 Type 2 diabetes mellitus without complications: Secondary | ICD-10-CM | POA: Insufficient documentation

## 2020-04-19 DIAGNOSIS — F17210 Nicotine dependence, cigarettes, uncomplicated: Secondary | ICD-10-CM | POA: Insufficient documentation

## 2020-04-19 DIAGNOSIS — L03031 Cellulitis of right toe: Secondary | ICD-10-CM | POA: Insufficient documentation

## 2020-04-19 DIAGNOSIS — Z7984 Long term (current) use of oral hypoglycemic drugs: Secondary | ICD-10-CM | POA: Insufficient documentation

## 2020-04-19 LAB — BASIC METABOLIC PANEL
Anion gap: 9 (ref 5–15)
BUN: 14 mg/dL (ref 6–20)
CO2: 23 mmol/L (ref 22–32)
Calcium: 9.3 mg/dL (ref 8.9–10.3)
Chloride: 103 mmol/L (ref 98–111)
Creatinine, Ser: 0.48 mg/dL (ref 0.44–1.00)
GFR calc Af Amer: >60 mL/min (ref >60)
GFR calc non Af Amer: >60 mL/min (ref >60)
Glucose, Bld: 290 mg/dL — ABNORMAL HIGH (ref 70–99)
Potassium: 4 mmol/L (ref 3.5–5.1)
Sodium: 135 mmol/L (ref 135–145)

## 2020-04-19 LAB — CBC WITH DIFFERENTIAL/PLATELET
Abs Immature Granulocytes: 0.05 10*3/uL (ref 0.00–0.07)
Basophils Absolute: 0.1 10*3/uL (ref 0.0–0.1)
Basophils Relative: 1 %
Eosinophils Absolute: 0.2 10*3/uL (ref 0.0–0.5)
Eosinophils Relative: 2 %
HCT: 45.5 % (ref 36.0–46.0)
Hemoglobin: 15.3 g/dL — ABNORMAL HIGH (ref 12.0–15.0)
Immature Granulocytes: 1 %
Lymphocytes Relative: 24 %
Lymphs Abs: 2.4 10*3/uL (ref 0.7–4.0)
MCH: 31 pg (ref 26.0–34.0)
MCHC: 33.6 g/dL (ref 30.0–36.0)
MCV: 92.1 fL (ref 80.0–100.0)
Monocytes Absolute: 0.8 10*3/uL (ref 0.1–1.0)
Monocytes Relative: 8 %
Neutro Abs: 6.3 10*3/uL (ref 1.7–7.7)
Neutrophils Relative %: 64 %
Platelets: 303 10*3/uL (ref 150–400)
RBC: 4.94 MIL/uL (ref 3.87–5.11)
RDW: 12.2 % (ref 11.5–15.5)
WBC: 9.8 10*3/uL (ref 4.0–10.5)
nRBC: 0 % (ref 0.0–0.2)

## 2020-04-19 MED ORDER — SULFAMETHOXAZOLE-TRIMETHOPRIM 800-160 MG PO TABS: 1.0000 | ORAL_TABLET | Freq: Two times a day (BID) | ORAL | 0 refills | Status: AC

## 2020-04-19 MED ORDER — AMOXICILLIN-POT CLAVULANATE 875-125 MG PO TABS
1.0000 | ORAL_TABLET | Freq: Two times a day (BID) | ORAL | 0 refills | Status: AC
Start: 2020-04-19 — End: ?

## 2020-04-19 NOTE — ED Notes (Signed)
Registration states pt is at [facility] long trying to check in

## 2020-04-19 NOTE — ED Notes (Signed)
No answer x1 for vitals recheck 

## 2020-04-19 NOTE — ED Notes (Signed)
No answer x2 for vitals recheck 

## 2020-04-19 NOTE — ED Triage Notes (Signed)
Pt c/o ingrown toe nails on both feet

## 2020-04-19 NOTE — ED Notes (Signed)
Patient stepped outside to go to car

## 2020-04-19 NOTE — ED Provider Notes (Signed)
[facility] DEPT Provider Note   CSN: [id number] Arrival date & time: 04/19/20  0214     History Chief Complaint  Patient presents with  . Ingrown Toenail    Bianca [name] is a 29 y.o. female.  Patient with past medical history notable for diabetes and hyperlipidemia presents to the emergency department with right toe pain.  She states that she recently had incision and drainage for right ingrown toenail at an outside urgent care.  She states that she has been on antibiotics.  She reports having worsening pain, redness, and swelling of the toe.  She denies any fevers or chills.  She states that she returned to urgent care, and had her antibiotic switched.  She has been compliant in taking the alternative antibiotic.  She states that she has not been compliant in soaking the toe.  She is concerned something more serious is going on with her toe.  The history is provided by the patient. No language interpreter was used.       Past Medical History:  Diagnosis Date  . Diabetes mellitus without complication ([place])   . Hyperlipidemia     Patient Active Problem List   Diagnosis Date Noted  . Type 2 diabetes mellitus with diabetic polyneuropathy, without long-term current use of insulin ([place]) 09/20/2019  . Pure hypertriglyceridemia 09/20/2019  . Open wound of left foot excluding one or more toes 09/20/2019  . Diabetic polyneuropathy associated with type 2 diabetes mellitus ([place]) 09/20/2019  . Tobacco use 09/20/2019    No past surgical history on file.   OB History   No obstetric history on file.     Family History  Problem Relation Age of Onset  . Cancer Mother        breast cancer  . Healthy Father   . Diabetes Maternal Grandmother   . Heart disease Maternal Grandmother   . Stroke Maternal Grandmother     Social History   Tobacco Use  . Smoking status: Current Every Day Smoker    Packs/day: 0.50    Types: Cigarettes  . Smokeless  tobacco: Never Used  Substance Use Topics  . Alcohol use: Never  . Drug use: Never    Home Medications Prior to Admission medications   Medication Sig Start Date End Date Taking? Authorizing Provider  doxycycline (VIBRAMYCIN) 100 MG capsule Take 1 capsule (100 mg total) by mouth 2 (two) times daily. 04/10/20  Yes Jaynee Eagles, PA-C  metFORMIN (GLUCOPHAGE) 500 MG tablet Take 1 tablet (500 mg total) by mouth 2 (two) times daily with a meal. 09/20/19  Yes [name], Charlene Brooke, NP  naproxen (NAPROSYN) 500 MG tablet Take 1 tablet (500 mg total) by mouth 2 (two) times daily. 04/10/20  Yes Jaynee Eagles, PA-C  blood glucose meter kit and supplies KIT Dispense based on patient and insurance preference. Check glucose before breakfast and at bedtime. ICD 10 code: E11.42 09/20/19   [name], Charlene Brooke, NP  Dulaglutide (TRULICITY) 7.61 YW/7.3XT SOPN Inject 0.75 mg into the skin once a week. Patient not taking: Reported on 04/19/2020 09/20/19   [name], Charlene Brooke, NP  fenofibrate (TRICOR) 145 MG tablet Take 1 tablet (145 mg total) by mouth daily. Patient not taking: Reported on 04/19/2020 09/23/19   [name], Charlene Brooke, NP  omega-3 acid ethyl esters (LOVAZA) 1 g capsule Take 1 capsule (1 g total) by mouth 2 (two) times daily. Patient not taking: Reported on 04/19/2020 09/20/19   [name], Charlene Brooke, NP  silver sulfADIAZINE (  SILVADENE) 1 % cream Apply 1 application topically 2 (two) times daily. Patient not taking: Reported on 09/20/2019 09/02/19   Sharion Balloon, NP    Allergies    Patient has no known allergies.  Review of Systems   Review of Systems  All other systems reviewed and are negative.   Physical Exam Updated Vital Signs BP (!) 151/86   Pulse 90   Temp 98 F (36.7 C) (Oral)   Resp 18   Ht '5\' 7"'$  (1.702 m)   Wt 117.9 kg   SpO2 100%   BMI 40.72 kg/m   Physical Exam Vitals and nursing note reviewed.  Constitutional:      General: She is not in acute distress.    Appearance: She is  well-developed.  HENT:     Head: Normocephalic and atraumatic.  Eyes:     Conjunctiva/sclera: Conjunctivae normal.  Cardiovascular:     Rate and Rhythm: Normal rate and regular rhythm.     Heart sounds: No murmur.  Pulmonary:     Effort: Pulmonary effort is normal. No respiratory distress.     Breath sounds: Normal breath sounds.  Abdominal:     Palpations: Abdomen is soft.     Tenderness: There is no abdominal tenderness.  Musculoskeletal:        General: Normal range of motion.     Cervical back: Neck supple.  Skin:    General: Skin is warm and dry.  Neurological:     Mental Status: She is alert and oriented to person, place, and time.  Psychiatric:        Mood and Affect: Mood normal.        Behavior: Behavior normal.     ED Results / Procedures / Treatments   Labs (all labs ordered are listed, but only abnormal results are displayed) Labs Reviewed  CBC WITH DIFFERENTIAL/PLATELET  BASIC METABOLIC PANEL    EKG None  Radiology No results found.  Procedures Procedures (including critical care time)  Medications Ordered in ED Medications - No data to display  ED Course  I have reviewed the triage vital signs and the nursing notes.  Pertinent labs & imaging results that were available during my care of the patient were reviewed by me and considered in my medical decision making (see chart for details).    MDM Rules/Calculators/A&P                      This patient complains of toe pain, this involves an extensive number of treatment options, and is a complaint that carries with it a high risk of complications and morbidity.  The differential diagnosis includes ingrown toenail, post-procedure infection, cellulitis.  I ordered, reviewed, and interpreted labs, which included CBC, BMP which were notable for hyperglcemia of 290, but otherwise reassuring.  No leukocytosis. I ordered imaging studies which included x-ray of great toe and I independently visualized  and interpreted imaging which showed no bony erosions or evidence of osteo.  After the interventions stated above, I reevaluated the patient and found the patient stable for discharge.  Will change abx to dual coverage with augmentin and bactrim.  Recommend follow-up with podiatry.  Return precautions discussed.  Final Clinical Impression(s) / ED Diagnoses Final diagnoses:  Cellulitis of great toe of right foot    Rx / DC Orders ED Discharge Orders    None       Montine Circle, PA-C 04/19/20 0559    Palumbo, April, MD 04/19/20  0703  

## 2020-04-27 ENCOUNTER — Other Ambulatory Visit: Payer: Self-pay

## 2020-04-27 ENCOUNTER — Encounter (HOSPITAL_COMMUNITY): Payer: Self-pay | Admitting: Emergency Medicine

## 2020-04-27 ENCOUNTER — Emergency Department (HOSPITAL_COMMUNITY): Payer: BC Managed Care – PPO

## 2020-04-27 ENCOUNTER — Emergency Department (HOSPITAL_COMMUNITY)
Admission: EM | Admit: 2020-04-27 | Discharge: 2020-04-28 | Disposition: A | Discharge status: Home / Self Care | Payer: BC Managed Care – PPO | Attending: Emergency Medicine | Admitting: Emergency Medicine

## 2020-04-27 DIAGNOSIS — E1142 Type 2 diabetes mellitus with diabetic polyneuropathy: Secondary | ICD-10-CM | POA: Diagnosis not present

## 2020-04-27 DIAGNOSIS — L03032 Cellulitis of left toe: Secondary | ICD-10-CM

## 2020-04-27 DIAGNOSIS — Z79899 Other long term (current) drug therapy: Secondary | ICD-10-CM | POA: Insufficient documentation

## 2020-04-27 DIAGNOSIS — E1165 Type 2 diabetes mellitus with hyperglycemia: Secondary | ICD-10-CM | POA: Insufficient documentation

## 2020-04-27 DIAGNOSIS — Z7984 Long term (current) use of oral hypoglycemic drugs: Secondary | ICD-10-CM | POA: Insufficient documentation

## 2020-04-27 DIAGNOSIS — F17210 Nicotine dependence, cigarettes, uncomplicated: Secondary | ICD-10-CM | POA: Diagnosis not present

## 2020-04-27 DIAGNOSIS — L03031 Cellulitis of right toe: Secondary | ICD-10-CM | POA: Diagnosis not present

## 2020-04-27 DIAGNOSIS — R739 Hyperglycemia, unspecified: Secondary | ICD-10-CM

## 2020-04-27 LAB — CBG MONITORING, ED: Glucose-Capillary: 283 mg/dL — ABNORMAL HIGH (ref 70–99)
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
## 2020-04-27 NOTE — ED Triage Notes (Addendum)
Patient seen multiple times for same. States abx she has tried are not working and the toes are still infected and draining. Patient just received insurance card today and has not been able to be seen by podiatry.

## 2020-04-28 MED ORDER — LIDOCAINE HCL (PF) 1 % IJ SOLN
30.0000 mL | Freq: Once | INTRAMUSCULAR | Status: AC
  Administered 2020-04-28: 03:00:00 30 mL
  Filled 2020-04-28: qty 30

## 2020-04-28 MED ORDER — CEFAZOLIN SODIUM-DEXTROSE 1-4 GM/50ML-% IV SOLN
1.0000 g | Freq: Once | INTRAVENOUS | Status: AC
  Administered 2020-04-28: 1 g via INTRAVENOUS
  Filled 2020-04-28: qty 50

## 2020-04-28 MED ORDER — TRULICITY 0.75 MG/0.5ML ~~LOC~~ SOPN
0.7500 mg | PEN_INJECTOR | SUBCUTANEOUS | 2 refills | Status: AC
Start: 2020-04-28 — End: ?

## 2020-04-28 MED ORDER — VANCOMYCIN HCL 2000 MG/400ML IV SOLN
2000.0000 mg | Freq: Once | INTRAVENOUS | Status: DC
  Filled 2020-04-28: qty 400

## 2020-04-28 NOTE — Consult Note (Signed)
  Subjective:  Patient ID: Bianca [name], female    DOB: July 17, 1991,  MRN: [id number]  Patient presents for recurrent issues of ingrown toenails of both feet.  Has been on multiple rounds of antibiotics.  Had incision and drainage performed 04/10/2020.  Still having warmth and redness of the toes.  Has been trying some soaking in warm water.  Complains of continued drainage from the toes. Objective:   Vitals:   04/27/20 2056  BP: (!) 129/94  Pulse: 92  Resp: 20  Temp: 97.7 F (36.5 C)  SpO2: 96%   General AA&O x3. Normal mood and affect.  Vascular Dorsalis pedis and posterior tibial pulses 2/4 bilat. Brisk capillary refill to all digits. Pedal hair present.  Neurologic Epicritic sensation grossly diminished. Protective sensation absent.  Dermatologic Paronychia bilateral with right hallux onycholysis and macerated nail bed. Left lysis  Orthopedic: Normal ROM without crepitus.    Assessment & Plan:  Patient was evaluated and treated and all questions answered.  Paronychia Bilateral Hallux -Nail manually avulsed right given onycholysis. Non-procedural. -Left nail avulsed as below -Recommend ancef IV for one dose -Educated on post-procedure soaks. -F/u next week for eval.  Procedure: Avulsion of toenail Location: Left 1st toe  Anesthesia: Lidocaine 1% plain; 1.5 mL and Marcaine 0.5% plain; 1.5 mL, digital block. Skin Prep: Betadine. Dressing: Silvadene; telfa; dry, sterile, compression dressing. Technique: Following skin prep, the nail was freed with a forcep and avulsed with a hemostat. The area was cleansed. Sterile compression dressing was applied. Disposition: Patient tolerated procedure well.   Park Liter, DPM  Best accessible via secure chat for questions or concerns.

## 2020-04-28 NOTE — ED Notes (Signed)
Alinda Money, RN notified that pt to be transferred to his assignment. Podiatry entered the room in [facility] and was informed that pt was being transferred at this time.

## 2020-04-28 NOTE — Progress Notes (Signed)
A consult was received from an ED physician for vancomycin per pharmacy dosing.  The patient's profile has been reviewed for ht/wt/allergies/indication/available labs.   A one time order has been placed for Vancomycin 2gm iv x1.  Further antibiotics/pharmacy consults should be ordered by admitting physician if indicated.                       Thank you, Aleene Davidson Crowford 04/28/2020  2:20 AM

## 2020-04-28 NOTE — Discharge Instructions (Addendum)
Soak Instructions  THE DAY AFTER THE PROCEDURE  Place 1/4 cup of epsom salts in a quart of warm tap water.  Submerge your foot or feet with outer bandage intact for the initial soak; this will allow the bandage to become moist and wet for easy lift off.  Once you remove your bandage, continue to soak in the solution for 20 minutes.  This soak should be done twice a day.  Next, remove your foot or feet from solution, blot dry the affected area and cover.  You may use a band aid large enough to cover the area or use gauze and tape.  Apply other medications to the area as directed by the doctor such as polysporin neosporin.  IF YOUR SKIN BECOMES IRRITATED WHILE USING THESE INSTRUCTIONS, IT IS OKAY TO SWITCH TO  WHITE VINEGAR AND WATER. Or you may use antibacterial soap and water to keep the toe clean  Monitor for any signs/symptoms of infection. Call the office immediately if any occur or go directly to the emergency room. Call with any questions/concerns.   You can also use the number on your discharge paperwork (page 11) to get established with a primary care provider.  I have given you a refill of your home Trulicity.  Please ensure that you follow-up with primary care to get your medications adjusted for diabetes.  Return to the emergency department if you develop fevers, red streaking up the toe, worsening numbness or weakness, significantly worsening swelling or redness to the digit, or other new, concerning symptoms.

## 2020-05-03 ENCOUNTER — Ambulatory Visit: Payer: BC Managed Care – PPO | Admitting: Podiatry

## 2020-05-03 ENCOUNTER — Other Ambulatory Visit: Payer: Self-pay

## 2020-05-03 DIAGNOSIS — M79676 Pain in unspecified toe(s): Secondary | ICD-10-CM

## 2020-05-03 DIAGNOSIS — L600 Ingrowing nail: Secondary | ICD-10-CM | POA: Diagnosis not present

## 2020-05-18 ENCOUNTER — Ambulatory Visit (INDEPENDENT_AMBULATORY_CARE_PROVIDER_SITE_OTHER): Payer: BC Managed Care – PPO | Admitting: Podiatry

## 2020-05-18 ENCOUNTER — Other Ambulatory Visit: Payer: Self-pay

## 2020-05-18 VITALS — Temp 96.7°F

## 2020-05-18 DIAGNOSIS — L600 Ingrowing nail: Secondary | ICD-10-CM | POA: Diagnosis not present

## 2020-05-18 DIAGNOSIS — M79676 Pain in unspecified toe(s): Secondary | ICD-10-CM | POA: Diagnosis not present

## 2020-05-23 ENCOUNTER — Telehealth: Payer: Self-pay

## 2020-05-23 NOTE — Telephone Encounter (Signed)
Pt seen by  Dr. Samuella Cota on Friday May 18, 2020 and was told to use a certain type of ointment on her toenail. Pt doesn't recall the name of the ointment. The information wasn't entered on her my chart or AVS. Also the progress notes aren't available to advise pt.

## 2020-05-25 NOTE — Telephone Encounter (Signed)
Pt called back following up on previous message about the ointment for her toenail.

## 2020-05-28 NOTE — Progress Notes (Signed)
  Subjective:  Patient ID: Bianca [name], female    DOB: 1991/06/27,  MRN: [id number]  Chief Complaint  Patient presents with  . Nail Problem    Bilateral hallux nail avulsion follow up. Pt states no concerns. Denies fever/chills/nausea/vomiting.    29 y.o. female presents for follow up of nail procedure. History confirmed with patient.   Objective:  Physical Exam: Ingrown nail avulsion site: overlying soft crust, no warmth, no drainage and no erythema Assessment:   1. Ingrown nail   2. Pain around toenail    Plan:  Patient was evaluated and treated and all questions answered.  S/p Ingrown Toenail Excision, bilateral -Healing well. Nails gently debrided. Continue soaking. -F/u in 2 weeks for recheck.

## 2020-05-28 NOTE — Telephone Encounter (Signed)
Left message informing pt of Dr. Kandice Hams orders for the OTC Neosporin or Triple antibiotic ointment.

## 2020-05-28 NOTE — Progress Notes (Signed)
  Subjective:  Patient ID: Bianca [name], female    DOB: Oct 03, 1991,  MRN: [id number]  Chief Complaint  Patient presents with  . Follow-up    Bilateral nail avulsion. Pt stated, "No pain due to neuropathy. No pus/foul odor/fever/chills/N&V".   29 y.o. female presents for follow up of nail procedure. History confirmed with patient.   Objective:  Physical Exam: Ingrown nail avulsion site: overlying soft crust, no warmth, no drainage and no erythema Assessment:   1. Ingrown nail   2. Pain around toenail    Plan:  Patient was evaluated and treated and all questions answered.  S/p Ingrown Toenail Excision, bilateral -Healing well. No further soaking. -Continue triple antibiotic ointment every other day with band-aid

## 2020-05-28 NOTE — Telephone Encounter (Signed)
It's just neosporin. I tried calling her to inform her but no answer. Can we try calling later in the day?

## 2020-06-01 ENCOUNTER — Ambulatory Visit (INDEPENDENT_AMBULATORY_CARE_PROVIDER_SITE_OTHER): Payer: BC Managed Care – PPO | Admitting: Podiatry

## 2020-06-01 ENCOUNTER — Other Ambulatory Visit: Payer: Self-pay

## 2020-06-01 DIAGNOSIS — M79676 Pain in unspecified toe(s): Secondary | ICD-10-CM | POA: Diagnosis not present

## 2020-06-01 DIAGNOSIS — L600 Ingrowing nail: Secondary | ICD-10-CM

## 2020-06-21 NOTE — Progress Notes (Signed)
  Subjective:  Patient ID: Bianca [name], female    DOB: 04-03-1991,  MRN: [id number]  No chief complaint on file.  29 y.o. female presents for follow up of nail procedure. Still having occasional pain. Has chronic redness. History confirmed with patient.   Objective:  Physical Exam: Ingrown nail avulsion sites: healing well no warmth. No ingrowing nail. Assessment:   1. Ingrown nail   2. Pain around toenail    Plan:  Patient was evaluated and treated and all questions answered.  S/p Ingrown Toenail Excision, bilateral -Healing well without recurrence. -F/u in 1 month for recheck to confirm no recurrence

## 2020-07-06 ENCOUNTER — Other Ambulatory Visit: Payer: Self-pay

## 2020-07-06 ENCOUNTER — Ambulatory Visit: Payer: BC Managed Care – PPO | Admitting: Podiatry

## 2020-07-06 DIAGNOSIS — M79676 Pain in unspecified toe(s): Secondary | ICD-10-CM

## 2020-07-06 DIAGNOSIS — L600 Ingrowing nail: Secondary | ICD-10-CM | POA: Diagnosis not present

## 2020-07-06 NOTE — Progress Notes (Signed)
  Subjective:  Patient ID: Bianca [name], female    DOB: [birth year],  MRN: [id number]  Chief Complaint  Patient presents with  . Follow-up    1 month nail check. Bilateral Hallux. Pt feels that the toes looks at very bad still. No drainage, no pain.    29 y.o. female presents for follow up of nail procedure. Still having occasional pain. Has chronic redness. History confirmed with patient.   Objective:  Physical Exam: Ingrown nail avulsion sites: healing well no recurrent ingrown nails, hyperkeratosis of the nail beds bilateral hallux. Thickening 2nd/3rd toenails bilat Assessment:   1. Ingrown nail   2. Pain around toenail    Plan:  Patient was evaluated and treated and all questions answered.  S/p Ingrown Toenail Excision, bilateral -Switch to soaks twice weekly -Nails debrided today -Callused areas debrided -Lotion areas daily

## 2020-08-17 ENCOUNTER — Ambulatory Visit (INDEPENDENT_AMBULATORY_CARE_PROVIDER_SITE_OTHER): Payer: BC Managed Care – PPO | Admitting: Podiatry

## 2020-08-17 ENCOUNTER — Other Ambulatory Visit: Payer: Self-pay

## 2020-08-17 DIAGNOSIS — B351 Tinea unguium: Secondary | ICD-10-CM | POA: Diagnosis not present

## 2020-08-17 DIAGNOSIS — L603 Nail dystrophy: Secondary | ICD-10-CM

## 2020-08-17 DIAGNOSIS — L600 Ingrowing nail: Secondary | ICD-10-CM | POA: Diagnosis not present

## 2020-08-17 DIAGNOSIS — M79676 Pain in unspecified toe(s): Secondary | ICD-10-CM

## 2020-08-17 MED ORDER — FLUCONAZOLE 150 MG PO TABS
150.0000 mg | ORAL_TABLET | ORAL | 2 refills | Status: AC
Start: 2020-08-17 — End: ?

## 2020-08-17 NOTE — Progress Notes (Signed)
  Subjective:  Patient ID: Bianca [name], female    DOB: 10-Jun-1991,  MRN: [id number]  Chief Complaint  Patient presents with  . Follow-up    Nail check, overall pt has no complaints.   . Nail Problem    Pt is concerned about left 3rd toenail. Pt spome with you last visit about this issue. She would like you to double check the nail    29 y.o. female presents for follow up of nail procedure. Still having occasional pain. Has chronic redness. History confirmed with patient.   Objective:  Physical Exam: Ingrown nail avulsion sites: well healed with hyperkeratosis of the nail beds bilateral hallux. Thickening 2nd/3rd toenails bilat, L>R  Right medial 1st pinch callus with possible small petechiae Assessment:   1. Ingrown nail   2. Pain around toenail   3. Onychomycosis   4. Onychodystrophy    Plan:  Patient was evaluated and treated and all questions answered.  S/p Ingrown Toenail Excision, bilateral -Resolved. There is remaining hyperkeratotic skin which was debrided with a nail curette.  Onychodystrophy/Onychomycosis -Nails debrided. Nail changes appear quite traumatic but will trial fluconazole weekly.  Callus right 1st MPJ, possible verruca -Lesion debrided and destroyed with salinocaine

## 2020-11-23 ENCOUNTER — Ambulatory Visit: Payer: BC Managed Care – PPO | Admitting: Podiatry

## 2020-11-23 ENCOUNTER — Other Ambulatory Visit: Payer: Self-pay

## 2020-11-23 ENCOUNTER — Encounter: Payer: Self-pay | Admitting: Podiatry

## 2020-11-23 DIAGNOSIS — B351 Tinea unguium: Secondary | ICD-10-CM | POA: Diagnosis not present

## 2020-11-23 DIAGNOSIS — E1169 Type 2 diabetes mellitus with other specified complication: Secondary | ICD-10-CM | POA: Diagnosis not present

## 2020-11-23 DIAGNOSIS — E1142 Type 2 diabetes mellitus with diabetic polyneuropathy: Secondary | ICD-10-CM | POA: Diagnosis not present

## 2020-11-23 NOTE — Progress Notes (Signed)
°  Subjective:  Patient ID: Bianca [name], female    DOB: 06-12-1991,  MRN: [id number]  Chief Complaint  Patient presents with   Nail Problem    PT stated that she is doing okay, she denies any pain but is concerned about the way the nails are growing back    29 y.o. female presents for follow-up. History above confirmed with patient.  Objective:  Physical Exam: Ingrown nail avulsion sites: well healed with hyperkeratosis of the nail beds bilateral hallux. Thickening 2nd/3rd toenails bilat, L>R Assessment:   1. Ingrown nail   2. Onychodystrophy   3. Pain around toenail    Plan:  Patient was evaluated and treated and all questions answered.  Onychodystrophy/Onychomycosis -Nails debrided. The nails appear somewhat improved. She does have some callusing at the distal great toes that may cause issues with nail growth. These were debrided as well. For now let the nails grow out further. Redness may or may not resolve with time.

## 2021-01-09 ENCOUNTER — Other Ambulatory Visit: Payer: Self-pay

## 2021-01-09 ENCOUNTER — Encounter (HOSPITAL_COMMUNITY): Payer: Self-pay | Admitting: *Deleted

## 2021-01-09 ENCOUNTER — Emergency Department (HOSPITAL_COMMUNITY)
Admission: EM | Admit: 2021-01-09 | Discharge: 2021-01-09 | Disposition: A | Discharge status: Home / Self Care | Payer: BC Managed Care – PPO | Source: Home / Self Care | Attending: Emergency Medicine | Admitting: Emergency Medicine

## 2021-01-09 ENCOUNTER — Encounter (HOSPITAL_COMMUNITY): Payer: BC Managed Care – PPO

## 2021-01-09 ENCOUNTER — Encounter (HOSPITAL_COMMUNITY): Payer: Self-pay | Admitting: Emergency Medicine

## 2021-01-09 ENCOUNTER — Emergency Department (HOSPITAL_BASED_OUTPATIENT_CLINIC_OR_DEPARTMENT_OTHER): Payer: BC Managed Care – PPO

## 2021-01-09 ENCOUNTER — Emergency Department (HOSPITAL_COMMUNITY)
Admission: EM | Admit: 2021-01-09 | Discharge: 2021-01-09 | Disposition: A | Discharge status: Home / Self Care | Payer: BC Managed Care – PPO | Attending: Emergency Medicine | Admitting: Emergency Medicine

## 2021-01-09 DIAGNOSIS — Z5321 Procedure and treatment not carried out due to patient leaving prior to being seen by health care provider: Secondary | ICD-10-CM | POA: Insufficient documentation

## 2021-01-09 DIAGNOSIS — F17210 Nicotine dependence, cigarettes, uncomplicated: Secondary | ICD-10-CM | POA: Insufficient documentation

## 2021-01-09 DIAGNOSIS — M79605 Pain in left leg: Secondary | ICD-10-CM | POA: Insufficient documentation

## 2021-01-09 DIAGNOSIS — E1142 Type 2 diabetes mellitus with diabetic polyneuropathy: Secondary | ICD-10-CM | POA: Insufficient documentation

## 2021-01-09 DIAGNOSIS — Z7984 Long term (current) use of oral hypoglycemic drugs: Secondary | ICD-10-CM | POA: Insufficient documentation

## 2021-01-09 LAB — CBG MONITORING, ED: Glucose-Capillary: 208 mg/dL — ABNORMAL HIGH (ref 70–99)

## 2021-01-09 MED ORDER — METHOCARBAMOL 500 MG PO TABS: 500.0000 mg | ORAL_TABLET | Freq: Two times a day (BID) | ORAL | 0 refills | Status: AC

## 2021-01-09 MED ORDER — IBUPROFEN 600 MG PO TABS
600.0000 mg | ORAL_TABLET | Freq: Four times a day (QID) | ORAL | 0 refills | Status: AC | PRN
Start: 2021-01-09 — End: ?

## 2021-01-09 NOTE — ED Triage Notes (Signed)
Patient complains of L leg pain, states her toes started hurting yesterday and not her left leg is hurting now, describes it as a tightness. Denies areas of redness, sores or wounds. No PCP at this time, no anti-diabetics and does not check her blood sugars. Denies pain w/ ambulation.

## 2021-01-09 NOTE — ED Notes (Signed)
Patient verbalized understanding of discharge instructions. Opportunity for questions and answers.  

## 2021-01-09 NOTE — ED Notes (Signed)
CBG: 208 

## 2021-01-09 NOTE — Discharge Instructions (Addendum)
Your your ultrasound did not show any evidence of blood clots.  I do not see signs of an arterial occlusion or infection in your leg.  Given pain present in the back of your leg that is worse with sitting question whether this could be related to sciatica.  I would like for you to treat this supportively with ibuprofen, Tylenol, you can use prescribed Robaxin to help with potential muscle spasm over the sciatic nerve.  You can also use ice and heat.  Use the exercises provided to help relieve pressure over the sciatic nerve and please follow-up with your PCP if symptoms or not improving.  If you develop significantly worsened pain in your leg, redness, swelling or other discoloration noted, if the leg feels cool to the touch or you noticed any wounds or new pain, return for reevaluation.

## 2021-01-09 NOTE — ED Provider Notes (Signed)
[facility] EMERGENCY DEPARTMENT Provider Note   CSN: [id number] Arrival date & time: 01/09/21  1358     History Chief Complaint  Patient presents with  . Leg Pain    Bianca [name] is a 30 y.o. female.  Bianca [name] is a 30 y.o. female with history of diabetes and hyperlipidemia, who presents to the emergency department for evaluation of left leg pain.  She reports that a few days ago she noticed some pain in her left second toe, she did not see any swelling redness or wounds in this area and states it felt similar to pain she has had with her neuropathy before, but then developed some pain in the back of her calf and behind the knee, and this concerned her.  She is very worried with her diabetes and does not want to have any infections or problems with blood flow in her legs that would lead to amputation.  She does have a family history of blood clots but has never personally had any blood clots.  Has not seen any swelling in the leg but has had this continued pain.  She states it seems to come and go, and seems to be worse when she is sitting.  She reports she has been out of work for the past few days because of the winter weather, is usually up on her feet all day, but has been sitting at home quite a bit and has noticed increasing pain.  She still with some intermittent pain in her back but this is never radiated into her legs before and did not know if it this could be contributing.  She has not seen any redness over the leg or foot and has not noted any warmth, again denies any wounds, no fevers or chills.  Has chronic neuropathy with decreased sensation in the feet, but no new numbness or weakness.  No chest pain or shortness of breath.  No other aggravating or alleviating factors.        Past Medical History:  Diagnosis Date  . Diabetes mellitus without complication ([place])   . Hyperlipidemia     Patient Active Problem List   Diagnosis Date Noted  . Type 2  diabetes mellitus with diabetic polyneuropathy, without long-term current use of insulin ([place]) 09/20/2019  . Pure hypertriglyceridemia 09/20/2019  . Open wound of left foot excluding one or more toes 09/20/2019  . Diabetic polyneuropathy associated with type 2 diabetes mellitus ([place]) 09/20/2019  . Tobacco use 09/20/2019    History reviewed. No pertinent surgical history.   OB History   No obstetric history on file.     Family History  Problem Relation Age of Onset  . Cancer Mother        breast cancer  . Healthy Father   . Diabetes Maternal Grandmother   . Heart disease Maternal Grandmother   . Stroke Maternal Grandmother     Social History   Tobacco Use  . Smoking status: Current Every Day Smoker    Packs/day: 0.50    Types: Cigarettes  . Smokeless tobacco: Never Used  Vaping Use  . Vaping Use: Never used  Substance Use Topics  . Alcohol use: Never  . Drug use: Never    Home Medications Prior to Admission medications   Medication Sig Start Date End Date Taking? Authorizing Provider  ibuprofen (ADVIL) 600 MG tablet Take 1 tablet (600 mg total) by mouth every 6 (six) hours as needed. 01/09/21  Yes Jacqlyn Larsen,  PA-C  methocarbamol (ROBAXIN) 500 MG tablet Take 1 tablet (500 mg total) by mouth 2 (two) times daily. 01/09/21  Yes Dartha Lodge, PA-C  amoxicillin-clavulanate (AUGMENTIN) 875-125 MG tablet Take 1 tablet by mouth every 12 (twelve) hours. 04/19/20   Roxy Horseman, PA-C  blood glucose meter kit and supplies KIT Dispense based on patient and insurance preference. Check glucose before breakfast and at bedtime. ICD 10 code: E11.42 09/20/19   [name], Bonna Gains, NP  Dulaglutide (TRULICITY) 0.75 MG/0.5ML SOPN Inject 0.75 mg into the skin once a week. 04/28/20   McDonald, Mia A, PA-C  fluconazole (DIFLUCAN) 150 MG tablet Take 1 tablet (150 mg total) by mouth once a week. 08/17/20   Park Liter, DPM  metFORMIN (GLUCOPHAGE) 500 MG tablet Take 1 tablet (500 mg total)  by mouth 2 (two) times daily with a meal. 09/20/19   [name], Bonna Gains, NP  naproxen (NAPROSYN) 500 MG tablet Take 1 tablet (500 mg total) by mouth 2 (two) times daily. 04/10/20   Wallis Bamberg, PA-C  fenofibrate (TRICOR) 145 MG tablet Take 1 tablet (145 mg total) by mouth daily. Patient not taking: Reported on 04/19/2020 09/23/19 04/28/20  [name], Bonna Gains, NP  omega-3 acid ethyl esters (LOVAZA) 1 g capsule Take 1 capsule (1 g total) by mouth 2 (two) times daily. Patient not taking: Reported on 04/19/2020 09/20/19 04/28/20  [name], Bonna Gains, NP    Allergies    Patient has no known allergies.  Review of Systems   Review of Systems  Constitutional: Negative for chills and fever.  Respiratory: Negative for shortness of breath.   Cardiovascular: Negative for chest pain and leg swelling.  Musculoskeletal: Positive for back pain and myalgias. Negative for arthralgias and joint swelling.  Skin: Negative for color change, rash and wound.  Neurological: Negative for weakness and numbness.  All other systems reviewed and are negative.   Physical Exam Updated Vital Signs BP 126/86 (BP Location: Right Arm)   Pulse 85   Temp 98.4 F (36.9 C) (Oral)   Resp 17   Ht 5\' 7"  (1.702 m)   Wt 108.9 kg   SpO2 100%   BMI 37.59 kg/m   Physical Exam Vitals and nursing note reviewed.  Constitutional:      General: She is not in acute distress.    Appearance: Normal appearance. She is well-developed and well-nourished. She is obese. She is not ill-appearing or diaphoretic.  HENT:     Head: Normocephalic and atraumatic.  Eyes:     General:        Right eye: No discharge.        Left eye: No discharge.  Cardiovascular:     Rate and Rhythm: Normal rate and regular rhythm.     Pulses:          Radial pulses are 2+ on the right side and 2+ on the left side.       Dorsalis pedis pulses are 2+ on the right side and 2+ on the left side.       Posterior tibial pulses are 2+ on the right side and 2+ on the  left side.     Heart sounds: Normal heart sounds.  Pulmonary:     Effort: Pulmonary effort is normal. No respiratory distress.     Breath sounds: Normal breath sounds.  Musculoskeletal:        General: Tenderness present.     Cervical back: Neck supple.     Right lower leg:  No edema.     Left lower leg: No edema.     Comments: There is some mild tenderness present in the over the posterior aspect of the left leg, in particular behind the knee and in the upper calf, but also reports some pain in the posterior thigh, worse with sitting, sometimes worse with straight leg raise. No palpable deformity or swelling, no erythema, or wounds, no bony tenderness. Distal pulses 2+ and equal bilaterally, warm to touch, no discoloration. Decreased sensation in feet is chronic 2/2 neuropathy.  Skin:    General: Skin is warm and dry.  Neurological:     Mental Status: She is alert and oriented to person, place, and time.     Coordination: Coordination normal.     Comments: Speech is clear, able to follow commands Normal strength in upper and lower extremities bilaterally including dorsiflexion and plantar flexion, strong and equal grip strength Sensation decreased in feet bilaterally due to chronic neuropathy, no acute changes in sensation Moves extremities without ataxia, coordination intact   Psychiatric:        Mood and Affect: Mood and affect and mood normal.        Behavior: Behavior normal.     ED Results / Procedures / Treatments   Labs (all labs ordered are listed, but only abnormal results are displayed) Labs Reviewed - No data to display  EKG None  Radiology VAS [place] LOWER EXTREMITY VENOUS (DVT) ([facility] and [facility] 7a-7p)  Result Date: 01/09/2021  Lower Venous DVT Study Other Indications: Left leg pain and tightness for the past few days. Comparison Study: No prior exams Performing Technologist: Rogelia Rohrer  Examination Guidelines: A complete evaluation includes B-mode imaging, spectral Doppler,  color Doppler, and power Doppler as needed of all accessible portions of each vessel. Bilateral testing is considered an integral part of a complete examination. Limited examinations for reoccurring indications may be performed as noted. The reflux portion of the exam is performed with the patient in reverse Trendelenburg.  +-----+---------------+---------+-----------+----------+--------------+ RIGHTCompressibilityPhasicitySpontaneityPropertiesThrombus Aging +-----+---------------+---------+-----------+----------+--------------+ CFV  Full           Yes      Yes                                 +-----+---------------+---------+-----------+----------+--------------+   +---------+---------------+---------+-----------+----------+--------------+ LEFT     CompressibilityPhasicitySpontaneityPropertiesThrombus Aging +---------+---------------+---------+-----------+----------+--------------+ CFV      Full           Yes      Yes                                 +---------+---------------+---------+-----------+----------+--------------+ SFJ      Full                                                        +---------+---------------+---------+-----------+----------+--------------+ FV Prox  Full           Yes      Yes                                 +---------+---------------+---------+-----------+----------+--------------+ FV Mid   Full  Yes      Yes                                 +---------+---------------+---------+-----------+----------+--------------+ FV DistalFull           Yes      Yes                                 +---------+---------------+---------+-----------+----------+--------------+ PFV      Full                                                        +---------+---------------+---------+-----------+----------+--------------+ POP      Full           Yes      Yes                                  +---------+---------------+---------+-----------+----------+--------------+ PTV      Full                                                        +---------+---------------+---------+-----------+----------+--------------+ PERO     Full                                                        +---------+---------------+---------+-----------+----------+--------------+     Summary: RIGHT: - No evidence of common femoral vein obstruction.  LEFT: - There is no evidence of deep vein thrombosis in the lower extremity. - There is no evidence of superficial venous thrombosis.  - No cystic structure found in the popliteal fossa.  *See table(s) above for measurements and observations.    Preliminary     Procedures Procedures (including critical care time)  Medications Ordered in ED Medications - No data to display  ED Course  I have reviewed the triage vital signs and the nursing notes.  Pertinent labs & imaging results that were available during my care of the patient were reviewed by me and considered in my medical decision making (see chart for details).    MDM Rules/Calculators/A&P                         30 year old female presents with left leg pain.  Started in her toe and initially thought it was her neuropathy, has not seen any redness, warmth, wounds or signs of infection but noticed some pain in the back of her calf, knee and thigh yesterday that concerned her.  Family history of blood clots, none personally, no associated chest pain or shortness of breath.  No fevers or chills.  Distal pulses equal, lower extremities are warm and well-perfused, chronic decreased sensation due to neuropathy but no new changes in sensation or strength.  Does have some intermittent low back pain, has been sitting more, question whether pain  could be related to sciatic nerve inflammation.  Will get DVT study.  I see no signs of infection.  Fortunately DVT study is negative.  No signs of arterial occlusion  or infection.  Pain in the back of the leg could be due to sciatic inflammation, will treat supportively and have patient follow-up closely with her PCP, but discussed strict return precautions.  Patient expresses understanding and agreement with this plan.  Discharged home in good condition.  Final Clinical Impression(s) / ED Diagnoses Final diagnoses:  Left leg pain    Rx / DC Orders ED Discharge Orders         Ordered    ibuprofen (ADVIL) 600 MG tablet  Every 6 hours PRN        01/09/21 2105    methocarbamol (ROBAXIN) 500 MG tablet  2 times daily        01/09/21 2105           Jacqlyn Larsen, [place] 01/12/21 1547    Lucrezia Starch, MD 01/14/21 1534

## 2021-01-09 NOTE — ED Triage Notes (Signed)
Pt reports being diabetic and having toe pain. Last night began having pain to back of left leg, behind her knee. No injury, no warmth or redness noted.

## 2021-01-09 NOTE — CV Procedure (Signed)
LLE venous duplex completed. Dr. Stevie Kern given preliminary results.  Results can be found under chart review under CV PROC. 01/09/2021 8:52 PM Alegria Dominique RVT, RDMS

## 2021-02-22 ENCOUNTER — Ambulatory Visit (INDEPENDENT_AMBULATORY_CARE_PROVIDER_SITE_OTHER): Payer: BC Managed Care – PPO

## 2021-02-22 ENCOUNTER — Ambulatory Visit: Payer: BC Managed Care – PPO | Admitting: Podiatry

## 2021-02-22 ENCOUNTER — Other Ambulatory Visit: Payer: Self-pay

## 2021-02-22 DIAGNOSIS — S92425A Nondisplaced fracture of distal phalanx of left great toe, initial encounter for closed fracture: Secondary | ICD-10-CM

## 2021-02-22 DIAGNOSIS — M79671 Pain in right foot: Secondary | ICD-10-CM

## 2021-02-22 DIAGNOSIS — S90112A Contusion of left great toe without damage to nail, initial encounter: Secondary | ICD-10-CM | POA: Diagnosis not present

## 2021-02-22 DIAGNOSIS — S90111A Contusion of right great toe without damage to nail, initial encounter: Secondary | ICD-10-CM | POA: Diagnosis not present

## 2021-02-22 DIAGNOSIS — M79672 Pain in left foot: Secondary | ICD-10-CM

## 2021-02-22 DIAGNOSIS — M722 Plantar fascial fibromatosis: Secondary | ICD-10-CM

## 2021-02-22 DIAGNOSIS — M729 Fibroblastic disorder, unspecified: Secondary | ICD-10-CM | POA: Diagnosis not present

## 2021-03-01 ENCOUNTER — Other Ambulatory Visit: Payer: Self-pay | Admitting: Podiatry

## 2021-03-01 DIAGNOSIS — M722 Plantar fascial fibromatosis: Secondary | ICD-10-CM

## 2021-03-01 DIAGNOSIS — M729 Fibroblastic disorder, unspecified: Secondary | ICD-10-CM

## 2021-03-21 NOTE — Progress Notes (Signed)
  Subjective:  Patient ID: Bianca [name], female    DOB: 02/15/1991,  MRN: [id number]  No chief complaint on file.   30 y.o. female presents with the above complaint. History confirmed with patient.  Reports bruising at the right and left great toe and left third toe.  States that this started after wearing new steel toe shoes.  Occurred 2 weeks ago.  Denies redness swelling and drainage.  No pain secondary to neuropathy.  Unsure of last fasting blood sugar and A1c. Objective:  Physical Exam: warm, good capillary refill, no trophic changes or ulcerative lesions, normal DP and PT pulses and absent protective sensation   bilateral great toes, third toe with bruising at the distal aspect of the digits.  No pain to palpation.  No images are attached to the encounter.  Radiographs: X-ray of both feet: Left distal phalanx fracture with small saucerization noted.  No other signs of acute fracture noted Assessment:   1. Contusion of right great toe without damage to nail, initial encounter   2. Contusion of left great toe without damage to nail, initial encounter   3. Closed nondisplaced fracture of distal phalanx of left great toe, initial encounter      Plan:  Patient was evaluated and treated and all questions answered.  Bilateral hallux injury with likely left hallux fracture -X-rays reviewed with patient -Buddy splinted left 1st/2nd toes.  Decrease activity level and consider open toed shoe gear -Follow-up for new x-rays in 1 month  No follow-ups on file.

## 2021-03-29 ENCOUNTER — Ambulatory Visit (INDEPENDENT_AMBULATORY_CARE_PROVIDER_SITE_OTHER): Payer: BC Managed Care – PPO | Admitting: Podiatry

## 2021-03-29 DIAGNOSIS — Z5329 Procedure and treatment not carried out because of patient's decision for other reasons: Secondary | ICD-10-CM

## 2021-03-29 NOTE — Progress Notes (Signed)
   Complete physical exam  Patient: Bianca [name]   DOB: 10/11/1999   30 y.o. Female  MRN: [id number]  Subjective:    No chief complaint on file.   Bianca [name] is a 30 y.o. female who presents today for a complete physical exam. She reports consuming a {diet types:17450} diet. {types:19826} She generally feels {DESC; WELL/FAIRLY WELL/POORLY:18703}. She reports sleeping {DESC; WELL/FAIRLY WELL/POORLY:18703}. She {does/does not:200015} have additional problems to discuss today.    Most recent fall risk assessment:    06/18/2022   10:42 AM  Fall Risk   Falls in the past year? 0  Number falls in past yr: 0  Injury with Fall? 0  Risk for fall due to : No Fall Risks  Follow up Falls evaluation completed     Most recent depression screenings:    06/18/2022   10:42 AM 05/09/2021   10:46 AM  PHQ 2/9 Scores  PHQ - 2 Score 0 0  PHQ- 9 Score 5     {VISON DENTAL STD PSA (Optional):27386}  {History (Optional):23778}  Patient Care Team: Jessup, Joy, NP as PCP - General (Nurse Practitioner)   Outpatient Medications Prior to Visit  Medication Sig   fluticasone (FLONASE) 50 MCG/ACT nasal spray Place 2 sprays into both nostrils in the morning and at bedtime. After 7 days, reduce to once daily.   norgestimate-ethinyl estradiol (SPRINTEC 28) 0.25-35 MG-MCG tablet Take 1 tablet by mouth daily.   Nystatin POWD Apply liberally to affected area 2 times per day   spironolactone (ALDACTONE) 100 MG tablet Take 1 tablet (100 mg total) by mouth daily.   No facility-administered medications prior to visit.    ROS        Objective:     There were no vitals taken for this visit. {Vitals History (Optional):23777}  Physical Exam   No results found for any visits on 07/24/22. {Show previous labs (optional):23779}    Assessment & Plan:    Routine Health Maintenance and Physical Exam  Immunization History  Administered Date(s) Administered   DTaP 12/25/1999, 02/20/2000,  04/30/2000, 01/14/2001, 07/30/2004   Hepatitis A 05/26/2008, 06/01/2009   Hepatitis B 10/12/1999, 11/19/1999, 04/30/2000   HiB (PRP-OMP) 12/25/1999, 02/20/2000, 04/30/2000, 01/14/2001   IPV 12/25/1999, 02/20/2000, 10/19/2000, 07/30/2004   Influenza,inj,Quad PF,6+ Mos 09/01/2014   Influenza-Unspecified 12/01/2012   MMR 10/19/2001, 07/30/2004   Meningococcal Polysaccharide 05/31/2012   Pneumococcal Conjugate-13 01/14/2001   Pneumococcal-Unspecified 04/30/2000, 07/14/2000   Tdap 05/31/2012   Varicella 10/19/2000, 05/26/2008    Health Maintenance  Topic Date Due   HIV Screening  Never done   Hepatitis C Screening  Never done   INFLUENZA VACCINE  07/22/2022   PAP-Cervical Cytology Screening  07/24/2022 (Originally 10/10/2020)   PAP SMEAR-Modifier  07/24/2022 (Originally 10/10/2020)   TETANUS/TDAP  07/24/2022 (Originally 05/31/2022)   HPV VACCINES  Discontinued   COVID-19 Vaccine  Discontinued    Discussed health benefits of physical activity, and encouraged her to engage in regular exercise appropriate for her age and condition.  Problem List Items Addressed This Visit   None Visit Diagnoses     Annual physical exam    -  Primary   Cervical cancer screening       Need for Tdap vaccination          No follow-ups on file.     Joy Jessup, NP   

## 2021-07-21 IMAGING — CR DG TOE GREAT 2+V*L*
3 series · 3 of 3 positions shown · non-contrast
Comparison: 11/02/2019

CLINICAL DATA: Infection, drainage

EXAM:
LEFT GREAT TOE

[x toes ap left]
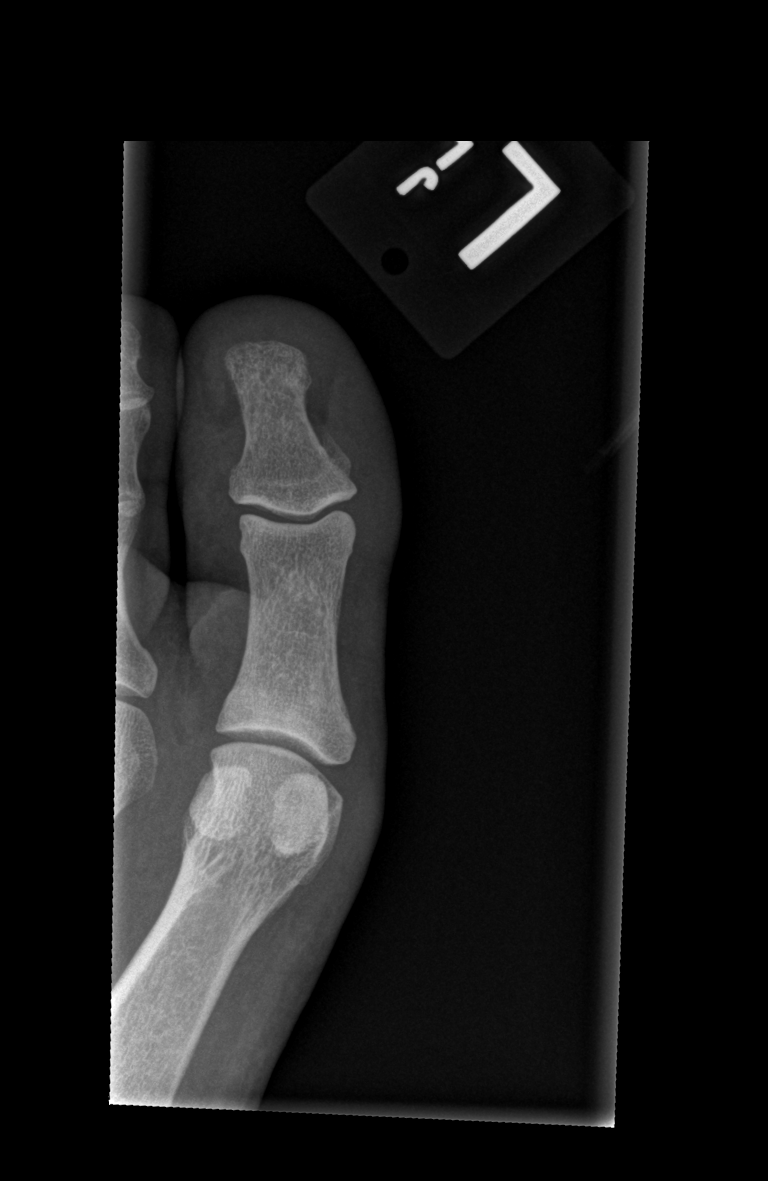

[x toes obl left]
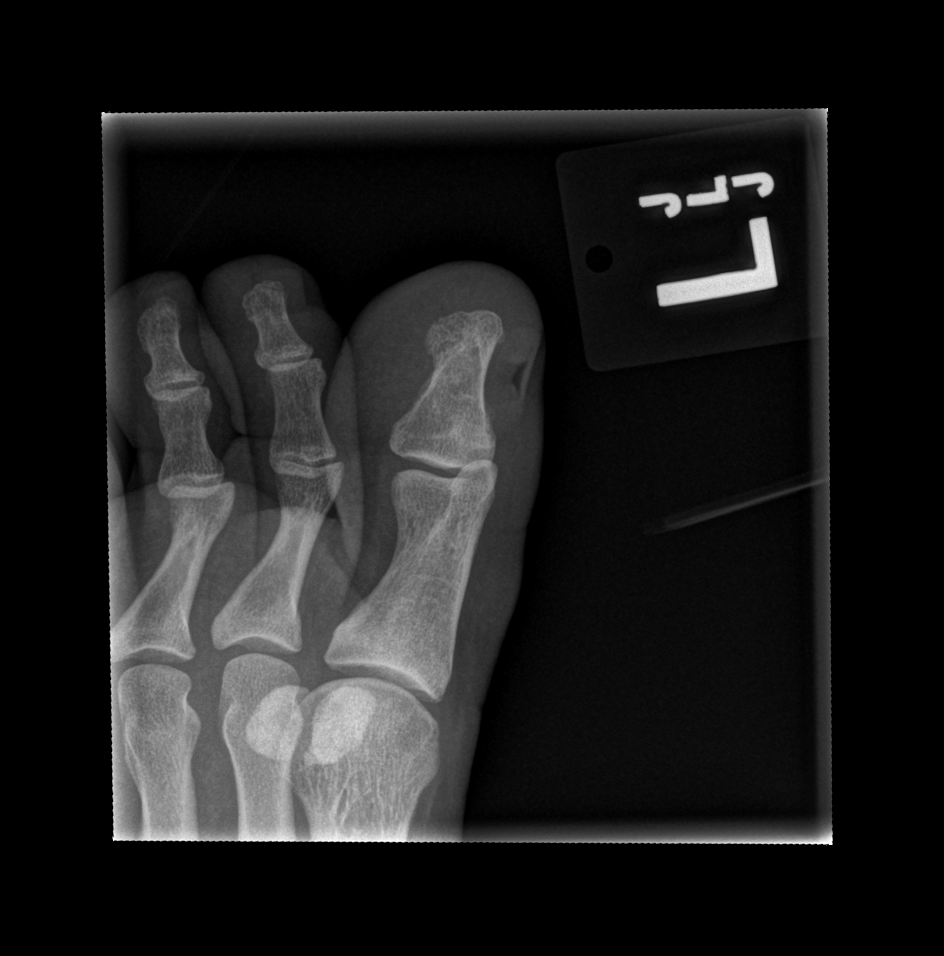

[x toes lat left]
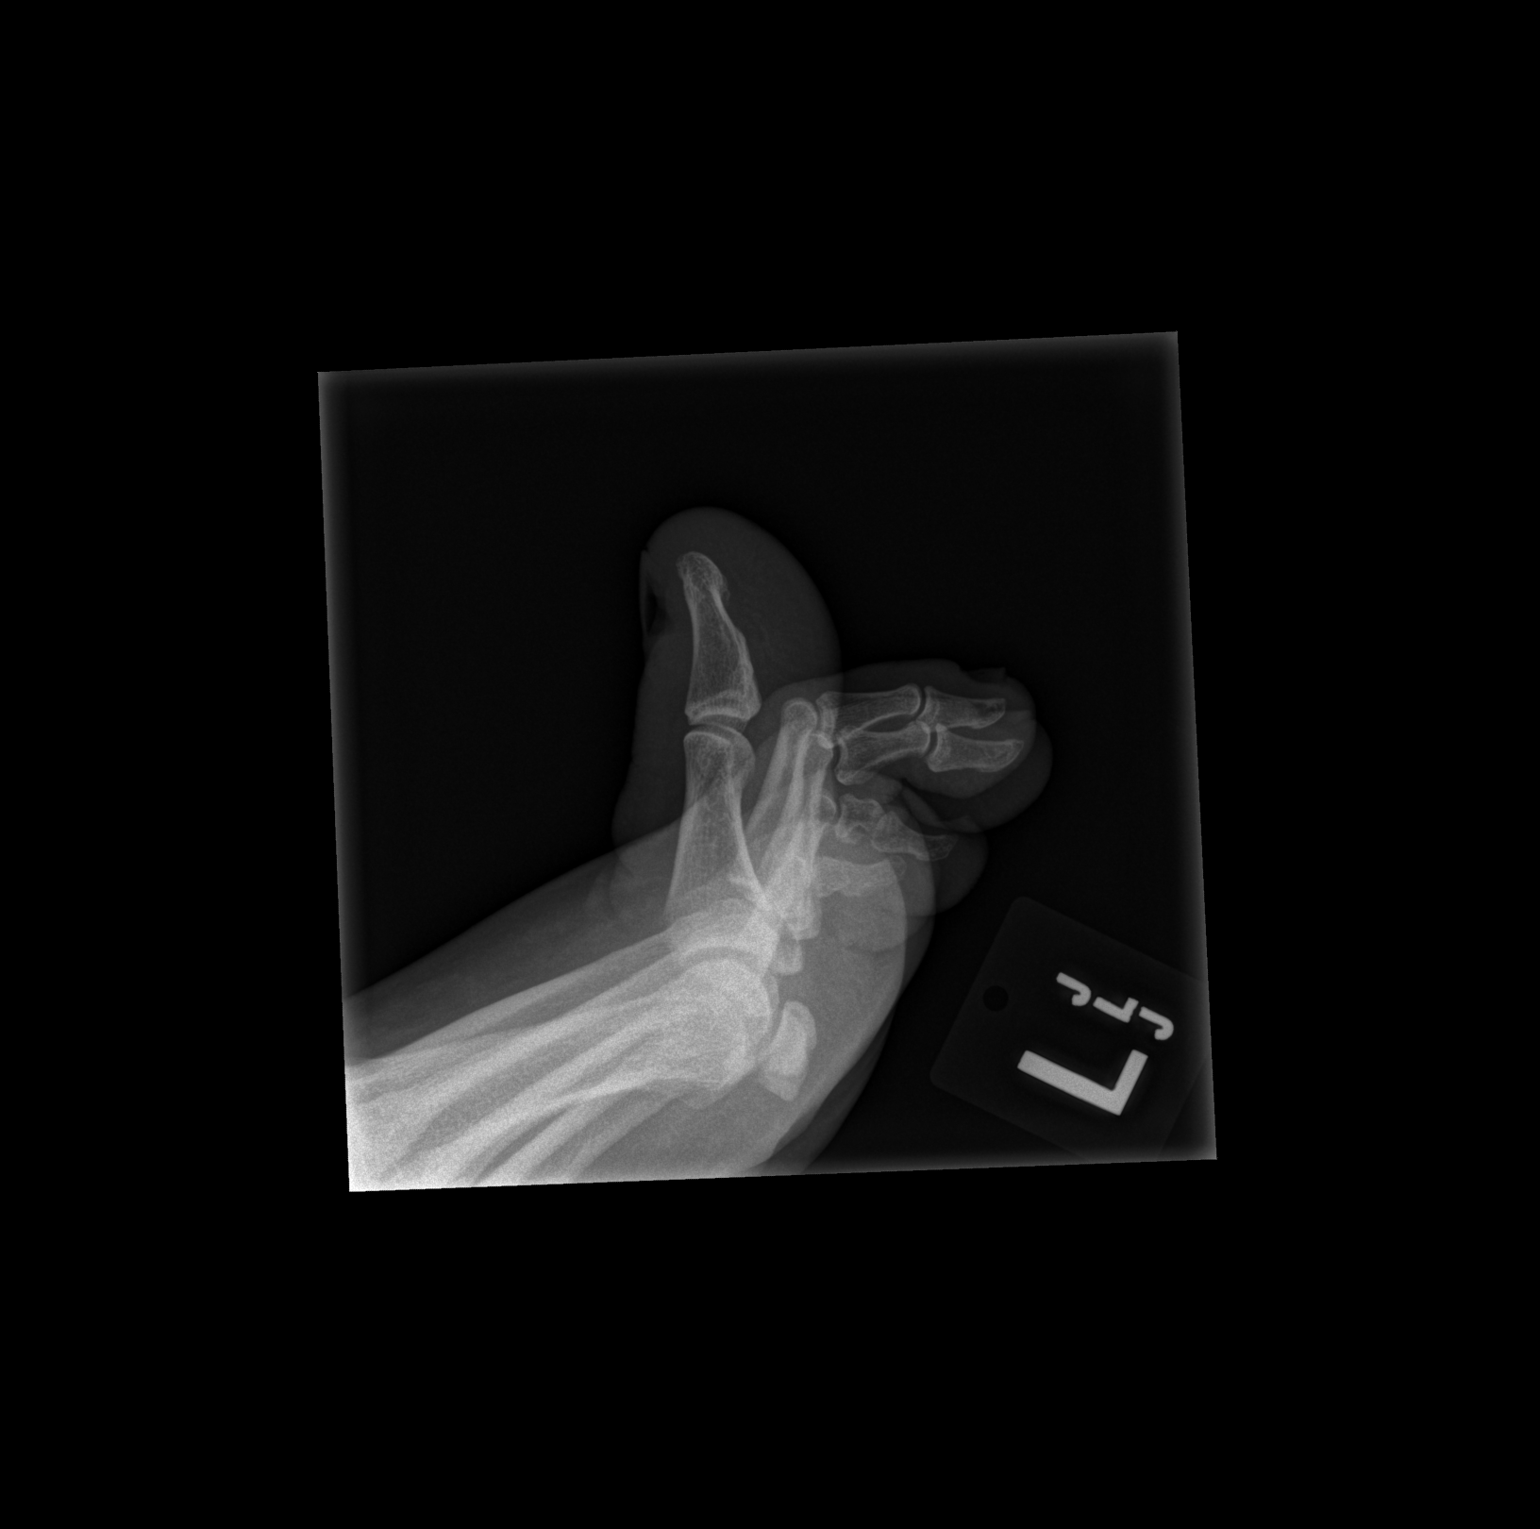

[3 of 3 positions shown; findings below may reference images not displayed]

FINDINGS: Frontal, oblique, and lateral views of the left great toe are
obtained. No fracture, subluxation, or dislocation. There are no
destructive bony lesions. No periosteal reaction. There is diffuse
soft tissue edema. Gas is seen within the nail bed.
IMPRESSION: 1. No acute or destructive bony lesions.
2. Diffuse soft tissue edema, with gas in the nail bed.

## 2021-07-21 IMAGING — CR DG TOE GREAT 2+V*R*
3 series · 3 of 3 positions shown · non-contrast
Comparison: Radiograph dated 04/19/2020.

CLINICAL DATA: 29-year-old female with right great toe infection.

EXAM:
RIGHT GREAT TOE

[x toes ap right]
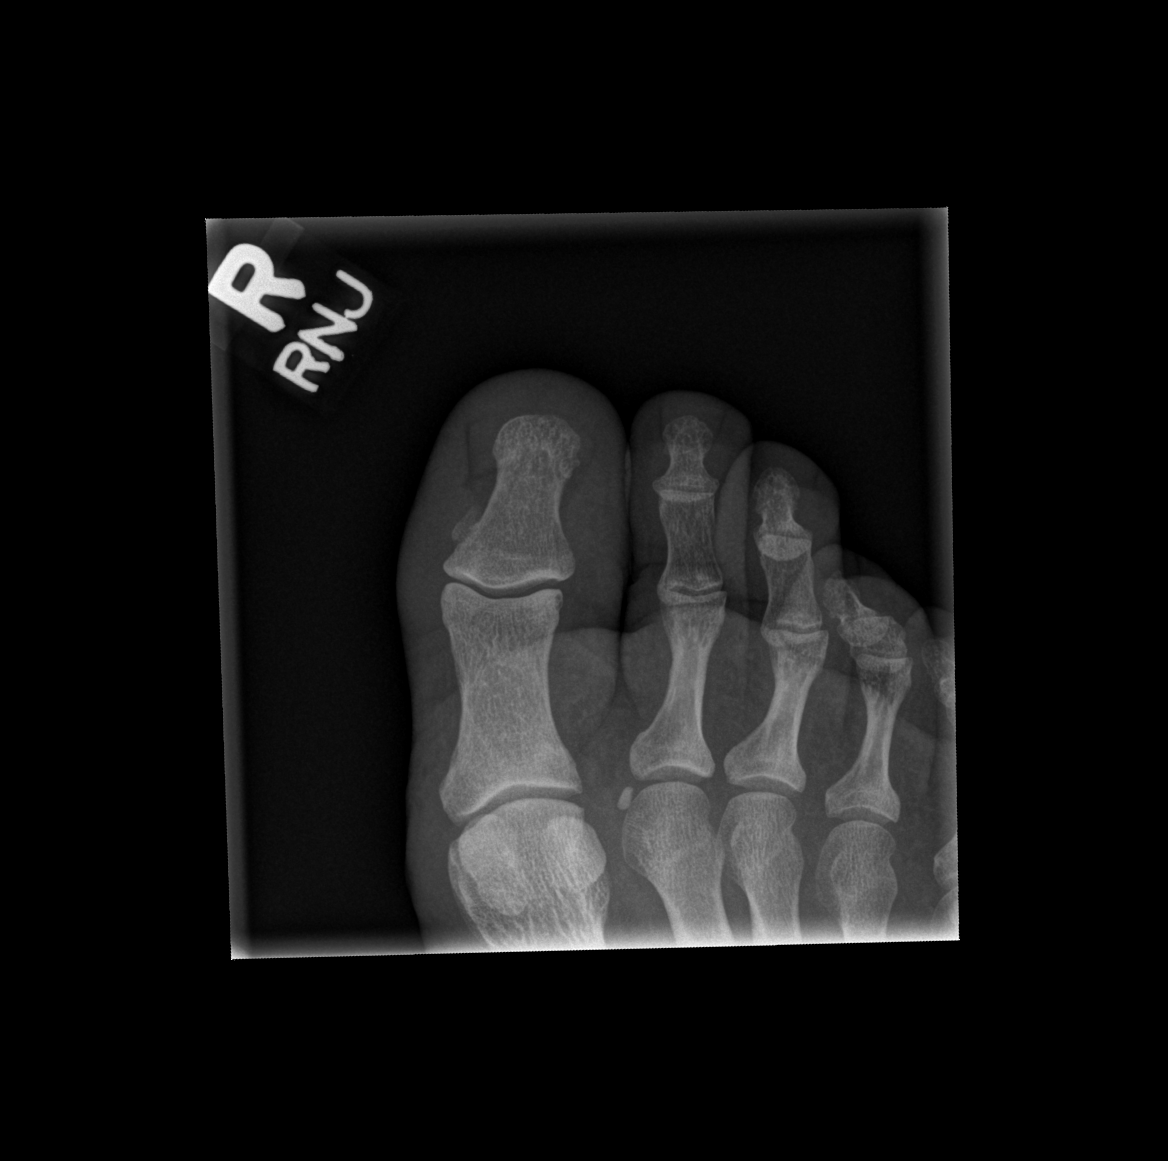

[x toes obl right]
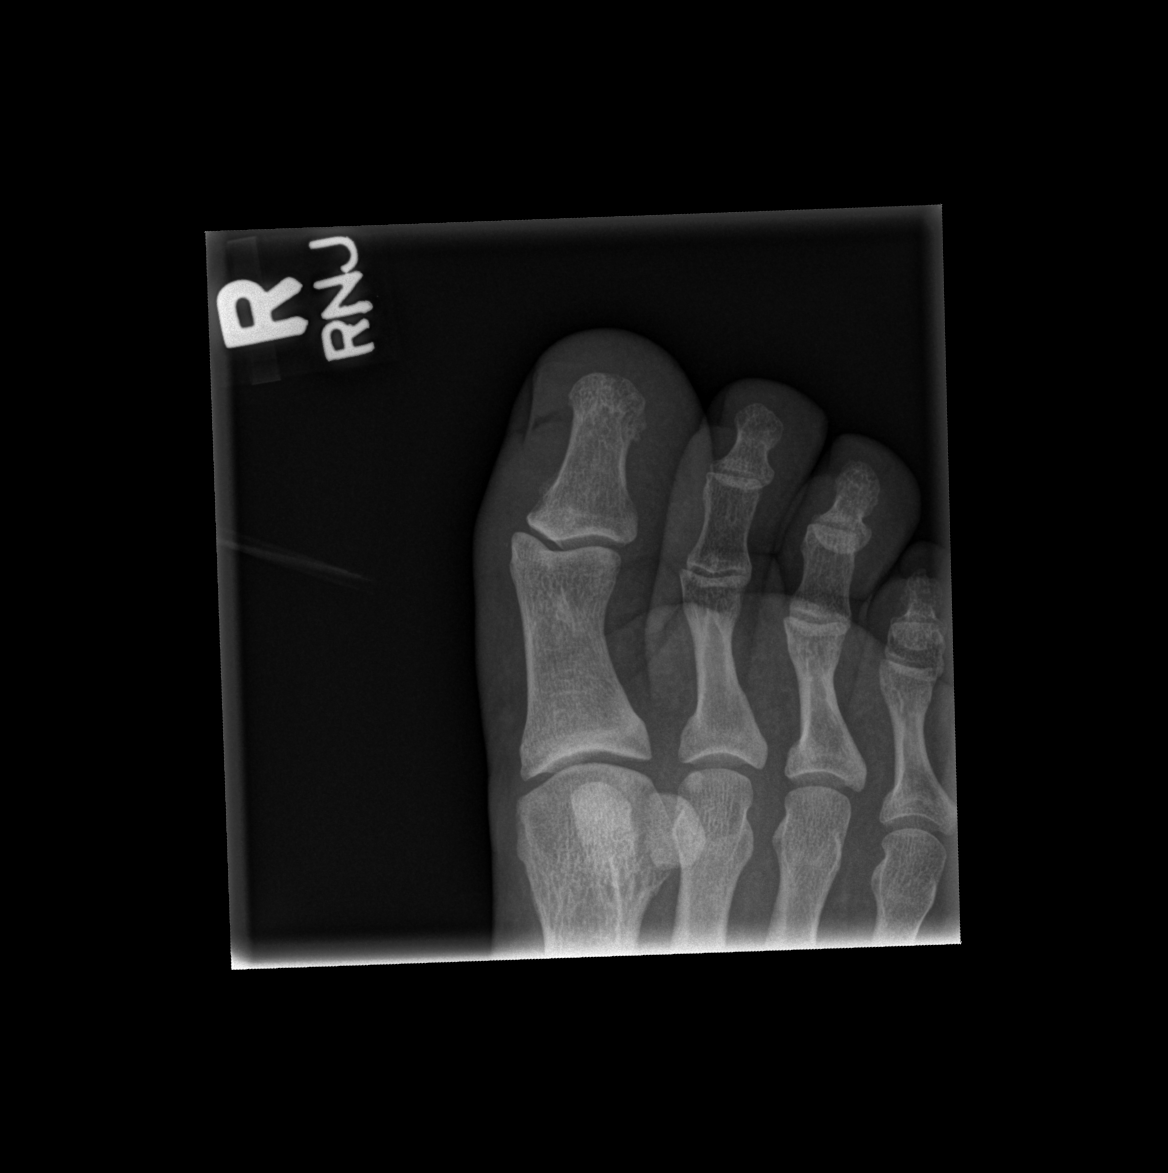

[x toes lat right]
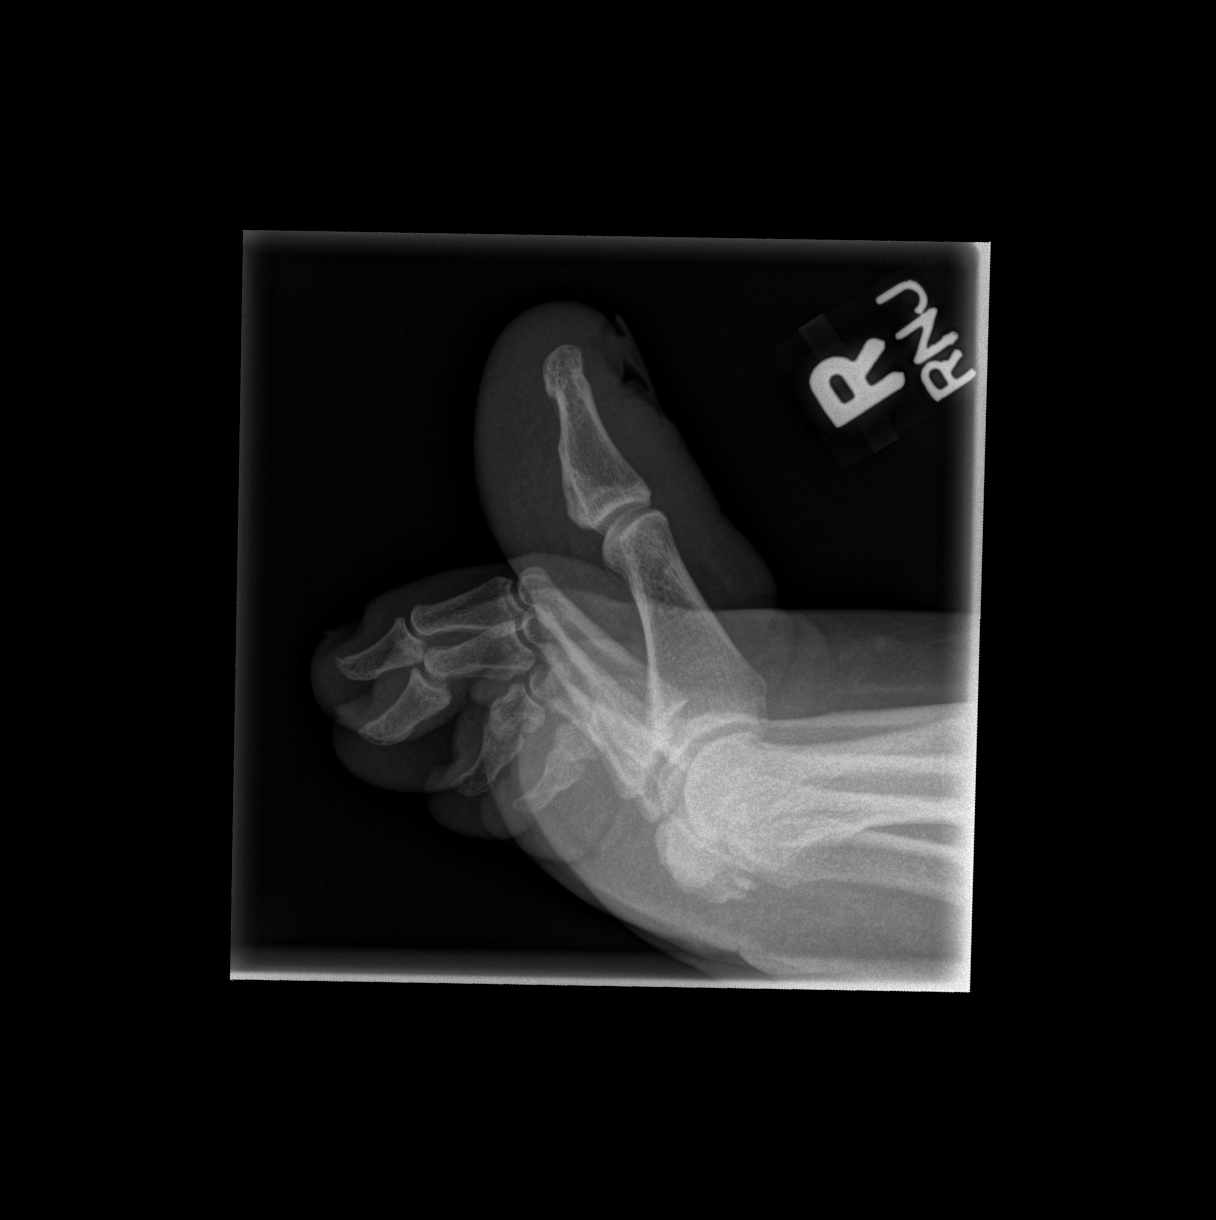

[3 of 3 positions shown; findings below may reference images not displayed]

FINDINGS: There is no acute fracture or dislocation. The bones are well
mineralized. No arthritic changes. No periosteal elevation or bone
erosion to suggest osteomyelitis. There is diffuse soft tissue
swelling of the great toe with elevation of the nail bed. Clinical
correlation is recommended.
IMPRESSION: 1. No acute fracture or dislocation.
2. Diffuse soft tissue swelling of the great toe with elevated nail.

## 2023-10-22 ENCOUNTER — Encounter (HOSPITAL_COMMUNITY): Payer: Self-pay

## 2023-10-22 ENCOUNTER — Other Ambulatory Visit: Payer: Self-pay

## 2023-10-22 ENCOUNTER — Emergency Department (HOSPITAL_COMMUNITY)
Admission: EM | Admit: 2023-10-22 | Discharge: 2023-10-22 | Disposition: A | Discharge status: Home / Self Care | Payer: PRIVATE HEALTH INSURANCE | Attending: Emergency Medicine | Admitting: Emergency Medicine

## 2023-10-22 ENCOUNTER — Emergency Department (HOSPITAL_COMMUNITY): Payer: PRIVATE HEALTH INSURANCE

## 2023-10-22 DIAGNOSIS — Z794 Long term (current) use of insulin: Secondary | ICD-10-CM | POA: Diagnosis not present

## 2023-10-22 DIAGNOSIS — R1010 Upper abdominal pain, unspecified: Secondary | ICD-10-CM

## 2023-10-22 DIAGNOSIS — Z91040 Latex allergy status: Secondary | ICD-10-CM | POA: Diagnosis not present

## 2023-10-22 DIAGNOSIS — N3001 Acute cystitis with hematuria: Secondary | ICD-10-CM | POA: Diagnosis not present

## 2023-10-22 DIAGNOSIS — R1084 Generalized abdominal pain: Secondary | ICD-10-CM | POA: Diagnosis present

## 2023-10-22 LAB — CBC WITH DIFFERENTIAL/PLATELET
Abs Immature Granulocytes: 0.05 10*3/uL (ref 0.00–0.07)
Basophils Absolute: 0.1 10*3/uL (ref 0.0–0.1)
Basophils Relative: 1 %
Eosinophils Absolute: 0.2 10*3/uL (ref 0.0–0.5)
Eosinophils Relative: 2 %
HCT: 43.8 % (ref 36.0–46.0)
Hemoglobin: 14.7 g/dL (ref 12.0–15.0)
Immature Granulocytes: 0 %
Lymphocytes Relative: 21 %
Lymphs Abs: 2.4 10*3/uL (ref 0.7–4.0)
MCH: 30.8 pg (ref 26.0–34.0)
MCHC: 33.6 g/dL (ref 30.0–36.0)
MCV: 91.8 fL (ref 80.0–100.0)
Monocytes Absolute: 0.9 10*3/uL (ref 0.1–1.0)
Monocytes Relative: 7 %
Neutro Abs: 7.9 10*3/uL — ABNORMAL HIGH (ref 1.7–7.7)
Neutrophils Relative %: 69 %
Platelets: 351 10*3/uL (ref 150–400)
RBC: 4.77 MIL/uL (ref 3.87–5.11)
RDW: 12.7 % (ref 11.5–15.5)
WBC: 11.5 10*3/uL — ABNORMAL HIGH (ref 4.0–10.5)
nRBC: 0 % (ref 0.0–0.2)

## 2023-10-22 LAB — COMPREHENSIVE METABOLIC PANEL
ALT: 27 U/L (ref 0–44)
AST: 19 U/L (ref 15–41)
Albumin: 4.4 g/dL (ref 3.5–5.0)
Alkaline Phosphatase: 42 U/L (ref 38–126)
Anion gap: 11 (ref 5–15)
BUN: 19 mg/dL (ref 6–20)
CO2: 25 mmol/L (ref 22–32)
Calcium: 10.2 mg/dL (ref 8.9–10.3)
Chloride: 105 mmol/L (ref 98–111)
Creatinine, Ser: 0.46 mg/dL (ref 0.44–1.00)
GFR, Estimated: >60 mL/min (ref >60)
Glucose, Bld: 177 mg/dL — ABNORMAL HIGH (ref 70–99)
Potassium: 4.4 mmol/L (ref 3.5–5.1)
Sodium: 141 mmol/L (ref 135–145)
Total Bilirubin: 0.9 mg/dL (ref 0.3–1.2)
Total Protein: 8 g/dL (ref 6.5–8.1)

## 2023-10-22 LAB — URINALYSIS, ROUTINE W REFLEX MICROSCOPIC
Bilirubin Urine: NEGATIVE
Glucose, UA: NEGATIVE mg/dL
Ketones, ur: NEGATIVE mg/dL
Leukocytes,Ua: NEGATIVE
Nitrite: POSITIVE — AB
Protein, ur: 100 mg/dL — AB
Specific Gravity, Urine: 1.029 (ref 1.005–1.030)
pH: 5 (ref 5.0–8.0)

## 2023-10-22 LAB — LIPASE, BLOOD: Lipase: 32 U/L (ref 11–51)

## 2023-10-22 LAB — HCG, QUANTITATIVE, PREGNANCY: hCG, Beta Chain, Quant, S: <1 m[IU]/mL (ref <5)

## 2023-10-22 MED ORDER — ONDANSETRON HCL 4 MG/2ML IJ SOLN
4.0000 mg | Freq: Once | INTRAMUSCULAR | Status: AC
  Administered 2023-10-22: 4 mg via INTRAVENOUS
  Filled 2023-10-22: qty 2

## 2023-10-22 MED ORDER — CEPHALEXIN 500 MG PO CAPS
500.0000 mg | ORAL_CAPSULE | Freq: Once | ORAL | Status: AC
  Administered 2023-10-22: 500 mg via ORAL
  Filled 2023-10-22: qty 1

## 2023-10-22 MED ORDER — PANTOPRAZOLE SODIUM 40 MG IV SOLR
40.0000 mg | Freq: Once | INTRAVENOUS | Status: AC
  Administered 2023-10-22: 40 mg via INTRAVENOUS
  Filled 2023-10-22: qty 10

## 2023-10-22 MED ORDER — IOHEXOL 300 MG/ML  SOLN
100.0000 mL | Freq: Once | INTRAMUSCULAR | Status: AC | PRN
  Administered 2023-10-22: 100 mL via INTRAVENOUS

## 2023-10-22 MED ORDER — HYDROMORPHONE HCL 1 MG/ML IJ SOLN
0.5000 mg | Freq: Once | INTRAMUSCULAR | Status: AC
  Administered 2023-10-22: 0.5 mg via INTRAVENOUS
  Filled 2023-10-22: qty 1

## 2023-10-22 MED ORDER — PANTOPRAZOLE SODIUM 20 MG PO TBEC: 20.0000 mg | DELAYED_RELEASE_TABLET | Freq: Every day | ORAL | 0 refills | Status: AC

## 2023-10-22 MED ORDER — CEPHALEXIN 500 MG PO CAPS: 500.0000 mg | ORAL_CAPSULE | Freq: Four times a day (QID) | ORAL | 0 refills | Status: AC

## 2023-10-22 NOTE — ED Triage Notes (Signed)
Patient has had left upper abdominal pain since she woke up today. No vomiting or diarrhea.

## 2023-10-22 NOTE — ED Provider Notes (Signed)
[place] EMERGENCY DEPARTMENT AT [facility] Provider Note   CSN: [id number] Arrival date & time: 10/22/23  1559     History  Chief Complaint  Patient presents with   Abdominal Pain    Bianca [name] is a 32 y.o. female.  Patient has a history of obesity and complains of abdominal pain.  The history is provided by the patient and medical records. No language interpreter was used.  Abdominal Pain Pain location:  Generalized Pain quality: aching   Pain radiates to:  Does not radiate Pain severity:  Moderate Onset quality:  Sudden Timing:  Constant Progression:  Worsening Chronicity:  New Context: not alcohol use   Relieved by:  Nothing Associated symptoms: no chest pain, no cough, no diarrhea, no fatigue and no hematuria        Home Medications Prior to Admission medications   Medication Sig Start Date End Date Taking? Authorizing Provider  cephALEXin (KEFLEX) 500 MG capsule Take 1 capsule (500 mg total) by mouth 4 (four) times daily. 10/22/23  Yes Bethann Berkshire, MD  pantoprazole (PROTONIX) 20 MG tablet Take 1 tablet (20 mg total) by mouth daily. 10/22/23  Yes Bethann Berkshire, MD  amoxicillin-clavulanate (AUGMENTIN) 875-125 MG tablet Take 1 tablet by mouth every 12 (twelve) hours. 04/19/20   Roxy Horseman, PA-C  blood glucose meter kit and supplies KIT Dispense based on patient and insurance preference. Check glucose before breakfast and at bedtime. ICD 10 code: E11.42 09/20/19   [name], Bonna Gains, NP  Dulaglutide (TRULICITY) 0.75 MG/0.5ML SOPN Inject 0.75 mg into the skin once a week. 04/28/20   McDonald, Mia A, PA-C  fluconazole (DIFLUCAN) 150 MG tablet Take 1 tablet (150 mg total) by mouth once a week. 08/17/20   Park Liter, DPM  ibuprofen (ADVIL) 600 MG tablet Take 1 tablet (600 mg total) by mouth every 6 (six) hours as needed. 01/09/21   Dartha Lodge, PA-C  metFORMIN (GLUCOPHAGE) 500 MG tablet Take 1 tablet (500 mg total) by mouth 2 (two) times  daily with a meal. 09/20/19   [name], Bonna Gains, NP  methocarbamol (ROBAXIN) 500 MG tablet Take 1 tablet (500 mg total) by mouth 2 (two) times daily. 01/09/21   Dartha Lodge, PA-C  naproxen (NAPROSYN) 500 MG tablet Take 1 tablet (500 mg total) by mouth 2 (two) times daily. 04/10/20   Wallis Bamberg, PA-C  fenofibrate (TRICOR) 145 MG tablet Take 1 tablet (145 mg total) by mouth daily. Patient not taking: Reported on 04/19/2020 09/23/19 04/28/20  [name], Bonna Gains, NP  omega-3 acid ethyl esters (LOVAZA) 1 g capsule Take 1 capsule (1 g total) by mouth 2 (two) times daily. Patient not taking: Reported on 04/19/2020 09/20/19 04/28/20  Anne Ng, NP      Allergies    Gabapentin and Latex    Review of Systems   Review of Systems  Constitutional:  Negative for appetite change and fatigue.  HENT:  Negative for congestion, ear discharge and sinus pressure.   Eyes:  Negative for discharge.  Respiratory:  Negative for cough.   Cardiovascular:  Negative for chest pain.  Gastrointestinal:  Positive for abdominal pain. Negative for diarrhea.  Genitourinary:  Negative for frequency and hematuria.  Musculoskeletal:  Negative for back pain.  Skin:  Negative for rash.  Neurological:  Negative for seizures and headaches.  Psychiatric/Behavioral:  Negative for hallucinations.     Physical Exam Updated Vital Signs BP 129/85   Pulse 79   Temp 98 F (  36.7 C) (Oral)   Resp 18   Ht 5\' 7"  (1.702 m)   Wt 104.3 kg   SpO2 95%   BMI 36.02 kg/m  Physical Exam Vitals and nursing note reviewed.  Constitutional:      Appearance: She is well-developed.  HENT:     Head: Normocephalic.     Mouth/Throat:     Mouth: Mucous membranes are moist.  Eyes:     General: No scleral icterus.    Conjunctiva/sclera: Conjunctivae normal.  Neck:     Thyroid: No thyromegaly.  Cardiovascular:     Rate and Rhythm: Normal rate and regular rhythm.     Heart sounds: No murmur heard.    No friction rub. No gallop.   Pulmonary:     Breath sounds: No stridor. No wheezing or rales.  Chest:     Chest wall: No tenderness.  Abdominal:     General: There is no distension.     Tenderness: There is abdominal tenderness. There is no rebound.  Musculoskeletal:        General: Normal range of motion.     Cervical back: Neck supple.  Lymphadenopathy:     Cervical: No cervical adenopathy.  Skin:    Findings: No erythema or rash.  Neurological:     Mental Status: She is alert and oriented to person, place, and time.     Motor: No abnormal muscle tone.     Coordination: Coordination normal.  Psychiatric:        Behavior: Behavior normal.     ED Results / Procedures / Treatments   Labs (all labs ordered are listed, but only abnormal results are displayed) Labs Reviewed  CBC WITH DIFFERENTIAL/PLATELET - Abnormal; Notable for the following components:      Result Value   WBC 11.5 (*)    Neutro Abs 7.9 (*)    All other components within normal limits  COMPREHENSIVE METABOLIC PANEL - Abnormal; Notable for the following components:   Glucose, Bld 177 (*)    All other components within normal limits  URINALYSIS, ROUTINE W REFLEX MICROSCOPIC - Abnormal; Notable for the following components:   APPearance CLOUDY (*)    Hgb urine dipstick SMALL (*)    Protein, ur 100 (*)    Nitrite POSITIVE (*)    Bacteria, UA MANY (*)    All other components within normal limits  LIPASE, BLOOD  HCG, QUANTITATIVE, PREGNANCY    EKG None  Radiology CT ABDOMEN PELVIS W CONTRAST  Result Date: 10/22/2023 CLINICAL DATA:  Left lower quadrant pain. EXAM: CT ABDOMEN AND PELVIS WITH CONTRAST TECHNIQUE: Multidetector CT imaging of the abdomen and pelvis was performed using the standard protocol following bolus administration of intravenous contrast. RADIATION DOSE REDUCTION: This exam was performed according to the departmental dose-optimization program which includes automated exposure control, adjustment of the mA and/or kV  according to patient size and/or use of iterative reconstruction technique. CONTRAST:  OMNIPAQUE IOHEXOL 300 MG/ML  SOLN COMPARISON:  None Available. FINDINGS: Lower chest: No acute abnormality. Hepatobiliary: No focal liver abnormality is seen. No gallstones, gallbladder wall thickening, or biliary dilatation. Pancreas: Unremarkable. No pancreatic ductal dilatation or surrounding inflammatory changes. Spleen: Normal in size without focal abnormality. Adrenals/Urinary Tract: Adrenal glands are unremarkable. Kidneys are normal, without renal calculi, focal lesion, or hydronephrosis. Bladder is unremarkable. Stomach/Bowel: Stomach is within normal limits. Appendix appears normal. No evidence of bowel wall thickening, distention, or inflammatory changes. Vascular/Lymphatic: No significant vascular findings are present. No enlarged abdominal  or pelvic lymph nodes. Reproductive: Properly positioned IUD is seen within an otherwise normal appearing uterus. The bilateral adnexa are unremarkable. Other: No abdominal wall hernia or abnormality. No abdominopelvic ascites. Musculoskeletal: No acute or significant osseous findings. IMPRESSION: 1. No acute or active process within the abdomen or pelvis. 2. Properly positioned IUD. Electronically Signed   By: Aram Candela M.D.   On: 10/22/2023 21:20    Procedures Procedures    Medications Ordered in ED Medications  cephALEXin (KEFLEX) capsule 500 mg (has no administration in time range)  HYDROmorphone (DILAUDID) injection 0.5 mg (0.5 mg Intravenous Given 10/22/23 2007)  ondansetron (ZOFRAN) injection 4 mg (4 mg Intravenous Given 10/22/23 2006)  pantoprazole (PROTONIX) injection 40 mg (40 mg Intravenous Given 10/22/23 2007)  iohexol (OMNIPAQUE) 300 MG/ML solution 100 mL (100 mLs Intravenous Contrast Given 10/22/23 1946)    ED Course/ Medical Decision Making/ A&P                                 Medical Decision Making Amount and/or Complexity of Data  Reviewed Labs: ordered.  Risk Prescription drug management.     This patient presents to the ED for concern of lower abdominal pain, this involves an extensive number of treatment options, and is a complaint that carries with it a high risk of complications and morbidity.  The differential diagnosis includes appendicitis, UTI   Co morbidities that complicate the patient evaluation  Obesity   Additional history obtained:  Additional history obtained from patient External records from outside source obtained and reviewed including hospital records   Lab Tests:  I Ordered, and personally interpreted labs.  The pertinent results include: Urinalysis shows many bacteria and positive nitrates   Imaging Studies ordered:  I ordered imaging studies including CT abdomen I independently visualized and interpreted imaging which showed negative I agree with the radiologist interpretation   Cardiac Monitoring: / EKG:  The patient was maintained on a cardiac monitor.  I personally viewed and interpreted the cardiac monitored which showed an underlying rhythm of: Normal   Consultations Obtained:  No consultant  Problem List / ED Course / Critical interventions / Medication management  Abdominal pain and obesity I ordered medication including Rocephin for UTI Reevaluation of the patient after these medicines showed that the patient stayed the same I have reviewed the patients home medicines and have made adjustments as needed   Social Determinants of Health:  None   Test / Admission - Considered:  None  Patient with urinary tract infection and possible gastritis.  She is started on Protonix and Keflex will follow-up with her PCP        Final Clinical Impression(s) / ED Diagnoses Final diagnoses:  Pain of upper abdomen  Acute cystitis with hematuria    Rx / DC Orders ED Discharge Orders          Ordered    cephALEXin (KEFLEX) 500 MG capsule  4 times daily         10/22/23 2141    pantoprazole (PROTONIX) 20 MG tablet  Daily        10/22/23 2141              Bethann Berkshire, MD 10/26/23 1226

## 2023-10-22 NOTE — Discharge Instructions (Signed)
Follow-up with your family doctor next week for recheck.  Drink plenty of fluids 

## 2023-10-22 NOTE — ED Provider Triage Note (Signed)
Emergency Medicine Provider Triage Evaluation Note  Bianca [name] , a 32 y.o. female  was evaluated in triage.  Pt complains of left-sided abdominal pain that started this morning.  She denies any nausea or vomiting.  Pain has slightly improved with Tylenol and Tums she took this morning.  She denies any fever or chills.  Last bowel movement was 2 days ago.  Denies any previous abdominal surgeries.  Review of Systems  Positive: As above Negative: As above  Physical Exam  BP (!) 141/91 (BP Location: Left Arm)   Pulse 88   Temp 98.4 F (36.9 C) (Oral)   Resp 18   Ht 5\' 7"  (1.702 m)   Wt 104.3 kg   SpO2 92%   BMI 36.02 kg/m  Gen:   Awake, no distress   Resp:  Normal effort  MSK:   Moves extremities without difficulty  Other:  Moderate abdominal palpation diffusely  Medical Decision Making  Medically screening exam initiated at 4:28 PM.  Appropriate orders placed.  Bianca [name] was informed that the remainder of the evaluation will be completed by another provider, this initial triage assessment does not replace that evaluation, and the importance of remaining in the ED until their evaluation is complete.     Arabella Merles, PA-C 10/22/23 1629

## 2023-10-25 LAB — URINE CULTURE: Culture: >=100000 — AB

## 2024-05-19 ENCOUNTER — Encounter (HOSPITAL_COMMUNITY): Payer: Self-pay | Admitting: Emergency Medicine

## 2024-05-19 ENCOUNTER — Emergency Department (HOSPITAL_COMMUNITY): Payer: PRIVATE HEALTH INSURANCE

## 2024-05-19 ENCOUNTER — Emergency Department (HOSPITAL_COMMUNITY)
Admission: EM | Admit: 2024-05-19 | Discharge: 2024-05-19 | Disposition: A | Discharge status: Home / Self Care | Payer: PRIVATE HEALTH INSURANCE | Attending: Emergency Medicine | Admitting: Emergency Medicine

## 2024-05-19 ENCOUNTER — Other Ambulatory Visit: Payer: Self-pay

## 2024-05-19 DIAGNOSIS — R112 Nausea with vomiting, unspecified: Secondary | ICD-10-CM | POA: Insufficient documentation

## 2024-05-19 DIAGNOSIS — R1084 Generalized abdominal pain: Secondary | ICD-10-CM | POA: Diagnosis not present

## 2024-05-19 DIAGNOSIS — Z91040 Latex allergy status: Secondary | ICD-10-CM | POA: Insufficient documentation

## 2024-05-19 DIAGNOSIS — R1031 Right lower quadrant pain: Secondary | ICD-10-CM | POA: Diagnosis present

## 2024-05-19 LAB — URINALYSIS, ROUTINE W REFLEX MICROSCOPIC
Bilirubin Urine: NEGATIVE
Glucose, UA: NEGATIVE mg/dL
Hgb urine dipstick: NEGATIVE
Ketones, ur: NEGATIVE mg/dL
Leukocytes,Ua: NEGATIVE
Nitrite: NEGATIVE
Protein, ur: NEGATIVE mg/dL
Specific Gravity, Urine: 1.018 (ref 1.005–1.030)
pH: 5 (ref 5.0–8.0)

## 2024-05-19 LAB — COMPREHENSIVE METABOLIC PANEL WITH GFR
ALT: 31 U/L (ref 0–44)
AST: 23 U/L (ref 15–41)
Albumin: 4.2 g/dL (ref 3.5–5.0)
Alkaline Phosphatase: 39 U/L (ref 38–126)
Anion gap: 8 (ref 5–15)
BUN: 14 mg/dL (ref 6–20)
CO2: 22 mmol/L (ref 22–32)
Calcium: 9.6 mg/dL (ref 8.9–10.3)
Chloride: 105 mmol/L (ref 98–111)
Creatinine, Ser: 0.61 mg/dL (ref 0.44–1.00)
GFR, Estimated: >60 mL/min (ref >60)
Glucose, Bld: 170 mg/dL — ABNORMAL HIGH (ref 70–99)
Potassium: 4.6 mmol/L (ref 3.5–5.1)
Sodium: 135 mmol/L (ref 135–145)
Total Bilirubin: 1 mg/dL (ref 0.0–1.2)
Total Protein: 7.7 g/dL (ref 6.5–8.1)

## 2024-05-19 LAB — CBC
HCT: 44.8 % (ref 36.0–46.0)
Hemoglobin: 15 g/dL (ref 12.0–15.0)
MCH: 30.9 pg (ref 26.0–34.0)
MCHC: 33.5 g/dL (ref 30.0–36.0)
MCV: 92.2 fL (ref 80.0–100.0)
Platelets: 300 10*3/uL (ref 150–400)
RBC: 4.86 MIL/uL (ref 3.87–5.11)
RDW: 12 % (ref 11.5–15.5)
WBC: 10.3 10*3/uL (ref 4.0–10.5)
nRBC: 0 % (ref 0.0–0.2)

## 2024-05-19 LAB — LIPASE, BLOOD: Lipase: 30 U/L (ref 11–51)

## 2024-05-19 LAB — HCG, SERUM, QUALITATIVE: Preg, Serum: NEGATIVE

## 2024-05-19 MED ORDER — OXYCODONE-ACETAMINOPHEN 5-325 MG PO TABS
1.0000 | ORAL_TABLET | Freq: Once | ORAL | Status: DC
Start: 2024-05-19 — End: 2024-05-19

## 2024-05-19 MED ORDER — IOHEXOL 300 MG/ML  SOLN
100.0000 mL | Freq: Once | INTRAMUSCULAR | Status: AC | PRN
  Administered 2024-05-19: 100 mL via INTRAVENOUS

## 2024-05-19 MED ORDER — SODIUM CHLORIDE 0.9 % IV BOLUS
1000.0000 mL | Freq: Once | INTRAVENOUS | Status: AC
  Administered 2024-05-19: 1000 mL via INTRAVENOUS

## 2024-05-19 MED ORDER — ONDANSETRON HCL 4 MG/2ML IJ SOLN
4.0000 mg | Freq: Once | INTRAMUSCULAR | Status: AC
  Administered 2024-05-19: 4 mg via INTRAVENOUS
  Filled 2024-05-19: qty 2

## 2024-05-19 MED ORDER — ONDANSETRON 4 MG PO TBDP
4.0000 mg | ORAL_TABLET | Freq: Three times a day (TID) | ORAL | 0 refills | Status: AC | PRN
Start: 2024-05-19 — End: ?

## 2024-05-19 MED ORDER — KETOROLAC TROMETHAMINE 15 MG/ML IJ SOLN
15.0000 mg | Freq: Once | INTRAMUSCULAR | Status: AC
  Administered 2024-05-19: 15 mg via INTRAVENOUS
  Filled 2024-05-19: qty 1

## 2024-05-19 MED ORDER — MORPHINE SULFATE (PF) 4 MG/ML IV SOLN
4.0000 mg | Freq: Once | INTRAVENOUS | Status: AC
Start: 2024-05-19 — End: 2024-05-19
  Administered 2024-05-19: 4 mg via INTRAVENOUS
  Filled 2024-05-19: qty 1

## 2024-05-19 MED ORDER — ONDANSETRON 4 MG PO TBDP
4.0000 mg | ORAL_TABLET | Freq: Once | ORAL | Status: DC
Start: 2024-05-19 — End: 2024-05-19

## 2024-05-19 NOTE — ED Triage Notes (Signed)
 Pt comes in pov for right lower quadrant abdominal pain. Nausea last night. LBM 2 days ago. Denies vomiting denies fevers.  Pain worse with movement

## 2024-05-19 NOTE — Discharge Instructions (Signed)
 Return for any problem.  ?

## 2024-05-19 NOTE — ED Provider Notes (Signed)
 [place] EMERGENCY DEPARTMENT AT [facility] Provider Note   CSN: [id number] Arrival date & time: 05/19/24  1451     History  Chief Complaint  Patient presents with   Abdominal Pain    Bianca [name] is a 33 y.o. female.  33 year old female presents with complaint of right lower quadrant abdominal pain.  She reports pain began last night.  This is associate with nausea and vomiting.  She reports diagnosis of UTI.  She has been on amoxicillin  x 24 hours.  The history is provided by the patient.       Home Medications Prior to Admission medications   Medication Sig Start Date End Date Taking? Authorizing Provider  amoxicillin -clavulanate (AUGMENTIN ) 875-125 MG tablet Take 1 tablet by mouth every 12 (twelve) hours. 04/19/20   Sherel Dikes, PA-C  blood glucose meter kit and supplies KIT Dispense based on patient and insurance preference. Check glucose before breakfast and at bedtime. ICD 10 code: E11.42 09/20/19   [name], Connye Delaine, NP  cephALEXin  (KEFLEX ) 500 MG capsule Take 1 capsule (500 mg total) by mouth 4 (four) times daily. 10/22/23   Cheyenne Cotta, MD  Dulaglutide  (TRULICITY ) 0.75 MG/0.5ML SOPN Inject 0.75 mg into the skin once a week. 04/28/20   McDonald, Mia A, PA-C  fluconazole  (DIFLUCAN ) 150 MG tablet Take 1 tablet (150 mg total) by mouth once a week. 08/17/20   Price, Michael J, DPM  ibuprofen  (ADVIL ) 600 MG tablet Take 1 tablet (600 mg total) by mouth every 6 (six) hours as needed. 01/09/21   Kehrli, Kelsey F, PA-C  metFORMIN  (GLUCOPHAGE ) 500 MG tablet Take 1 tablet (500 mg total) by mouth 2 (two) times daily with a meal. 09/20/19   [name], Connye Delaine, NP  methocarbamol  (ROBAXIN ) 500 MG tablet Take 1 tablet (500 mg total) by mouth 2 (two) times daily. 01/09/21   Everlyn Hockey, PA-C  naproxen  (NAPROSYN ) 500 MG tablet Take 1 tablet (500 mg total) by mouth 2 (two) times daily. 04/10/20   Adolph Hoop, PA-C  pantoprazole  (PROTONIX ) 20 MG tablet Take 1  tablet (20 mg total) by mouth daily. 10/22/23   Cheyenne Cotta, MD  fenofibrate  (TRICOR ) 145 MG tablet Take 1 tablet (145 mg total) by mouth daily. Patient not taking: Reported on 04/19/2020 09/23/19 04/28/20  [name], Connye Delaine, NP  omega-3 acid ethyl esters (LOVAZA ) 1 g capsule Take 1 capsule (1 g total) by mouth 2 (two) times daily. Patient not taking: Reported on 04/19/2020 09/20/19 04/28/20  [name], Connye Delaine, NP      Allergies    Gabapentin and Latex    Review of Systems   Review of Systems  All other systems reviewed and are negative.   Physical Exam Updated Vital Signs BP 139/82 (BP Location: Right Arm)   Pulse 81   Temp 98.3 F (36.8 C) (Oral)   Resp 12   Ht 5\' 7"  (1.702 m)   Wt 113.4 kg   SpO2 99%   BMI 39.16 kg/m  Physical Exam Vitals and nursing note reviewed.  Constitutional:      General: She is not in acute distress.    Appearance: Normal appearance. She is well-developed.  HENT:     Head: Normocephalic and atraumatic.  Eyes:     Conjunctiva/sclera: Conjunctivae normal.     Pupils: Pupils are equal, round, and reactive to light.  Cardiovascular:     Rate and Rhythm: Normal rate and regular rhythm.     Heart sounds: Normal heart sounds.  Pulmonary:     Effort: Pulmonary effort is normal. No respiratory distress.     Breath sounds: Normal breath sounds.  Abdominal:     General: There is no distension.     Palpations: Abdomen is soft.     Tenderness: There is abdominal tenderness in the right lower quadrant.  Musculoskeletal:        General: No deformity. Normal range of motion.     Cervical back: Normal range of motion and neck supple.  Skin:    General: Skin is warm and dry.  Neurological:     General: No focal deficit present.     Mental Status: She is alert and oriented to person, place, and time.     ED Results / Procedures / Treatments   Labs (all labs ordered are listed, but only abnormal results are displayed) Labs Reviewed  COMPREHENSIVE  METABOLIC PANEL WITH GFR - Abnormal; Notable for the following components:      Result Value   Glucose, Bld 170 (*)    All other components within normal limits  LIPASE, BLOOD  CBC  URINALYSIS, ROUTINE W REFLEX MICROSCOPIC  HCG, SERUM, QUALITATIVE    EKG None  Radiology No results found.  Procedures Procedures    Medications Ordered in ED Medications  sodium chloride 0.9 % bolus 1,000 mL (has no administration in time range)  ondansetron  (ZOFRAN ) injection 4 mg (has no administration in time range)  morphine (PF) 4 MG/ML injection 4 mg (has no administration in time range)    ED Course/ Medical Decision Making/ A&P                                 Medical Decision Making Amount and/or Complexity of Data Reviewed Labs: ordered.  Risk Prescription drug management.    Medical Screen Complete  This patient presented to the ED with complaint of abdominal pain.  This complaint involves an extensive number of treatment options. The initial differential diagnosis includes, but is not limited to, appendicitis, pyelonephritis, metabolic abnormality, etc.  This presentation is: Acute, Self-Limited, Previously Undiagnosed, Uncertain Prognosis, Complicated, Systemic Symptoms, and Threat to Life/Bodily Function  Patient presents with complaint of abdominal pain.  Pain is primarily right-sided.  She also reports concurrent use of antibiotics for the last week for treatment of presumed UTI.  Obtained imaging and labs are without significant acute abnormality.  Patient does feel improved after IV fluids and antiemetics and pain medication.  Patient desires discharge.  She understands need for close outpatient follow-up.  Strict return precautions given and understood.  Additional history obtained:  External records from outside sources obtained and reviewed including prior ED visits and prior Inpatient records.    Problem List / ED Course:  Abdominal  pain    Disposition:  After consideration of the diagnostic results and the patients response to treatment, I feel that the patent would benefit from close outpatient followup.          Final Clinical Impression(s) / ED Diagnoses Final diagnoses:  Generalized abdominal pain    Rx / DC Orders ED Discharge Orders          Ordered    ondansetron  (ZOFRAN -ODT) 4 MG disintegrating tablet  Every 8 hours PRN        05/19/24 2158              Burnette Carte, MD 05/19/24 2159

## 2024-05-19 NOTE — ED Provider Triage Note (Signed)
 Emergency Medicine Provider Triage Evaluation Note  Bianca [name] , a 33 y.o. female  was evaluated in triage.  Pt complains of right lower quadrant pain for the past 2 days.  Patient denies any fevers but has some nausea.  Patient has a vaginal bleeding or discharge dysuria diarrhea but states she has constipation from the M[place]o.  Patient states she still has her appendix.  Pain did not start at the periumbilical this and radiate to the right lower quadrant it is started in the right lower quadrant..  Review of Systems  Positive:  Negative:   Physical Exam  BP 132/89   Pulse 78   Temp 98.5 F (36.9 C) (Oral)   Resp 16   Ht 5\' 7"  (1.702 m)   Wt 113.4 kg   SpO2 97%   BMI 39.16 kg/m  Gen:   Awake, no distress   Resp:  Normal effort  MSK:   Moves extremities without difficulty  Other:  Right lower quadrant tenderness without peritoneal signs  Medical Decision Making  Medically screening exam initiated at 3:16 PM.  Appropriate orders placed.  Justeen Hehr was informed that the remainder of the evaluation will be completed by another provider, this initial triage assessment does not replace that evaluation, and the importance of remaining in the ED until their evaluation is complete.  Workup initiated, patient stable.   Denese Finn, PA-C 05/19/24 1516
